# Patient Record
Sex: Female | Born: 1965 | Race: White | Hispanic: No | Marital: Married | Smoking: Former smoker
Health system: Southern US, Community
[De-identification: ages and names within clinical notes are randomized; demographics above are authoritative.]

## PROBLEM LIST (undated history)

## (undated) DIAGNOSIS — I639 Cerebral infarction, unspecified: Secondary | ICD-10-CM

## (undated) DIAGNOSIS — Z8489 Family history of other specified conditions: Secondary | ICD-10-CM

## (undated) DIAGNOSIS — F329 Major depressive disorder, single episode, unspecified: Secondary | ICD-10-CM

## (undated) DIAGNOSIS — M1990 Unspecified osteoarthritis, unspecified site: Secondary | ICD-10-CM

## (undated) DIAGNOSIS — R51 Headache: Secondary | ICD-10-CM

## (undated) DIAGNOSIS — F32A Depression, unspecified: Secondary | ICD-10-CM

## (undated) DIAGNOSIS — T300 Burn of unspecified body region, unspecified degree: Secondary | ICD-10-CM

## (undated) DIAGNOSIS — R519 Headache, unspecified: Secondary | ICD-10-CM

## (undated) DIAGNOSIS — K5792 Diverticulitis of intestine, part unspecified, without perforation or abscess without bleeding: Secondary | ICD-10-CM

## (undated) DIAGNOSIS — I82409 Acute embolism and thrombosis of unspecified deep veins of unspecified lower extremity: Secondary | ICD-10-CM

## (undated) HISTORY — PX: CHOLECYSTECTOMY: SHX55

## (undated) HISTORY — PX: JOINT REPLACEMENT: SHX530

## (undated) HISTORY — PX: LAPAROSCOPIC SIGMOID COLECTOMY: SHX5928

## (undated) HISTORY — PX: OTHER SURGICAL HISTORY: SHX169

## (undated) HISTORY — PX: ABDOMINAL HYSTERECTOMY: SHX81

## (undated) HISTORY — PX: FOOT SURGERY: SHX648

## (undated) HISTORY — PX: ENDOMETRIAL ABLATION: SHX621

## (undated) HISTORY — PX: COLONOSCOPY W/ POLYPECTOMY: SHX1380

---
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
## 2005-04-09 ENCOUNTER — Ambulatory Visit: Payer: Self-pay

## 2006-08-10 ENCOUNTER — Ambulatory Visit: Payer: Self-pay | Admitting: Obstetrics and Gynecology

## 2008-01-03 ENCOUNTER — Ambulatory Visit: Payer: Self-pay

## 2008-05-11 ENCOUNTER — Ambulatory Visit: Payer: Self-pay | Admitting: Family Medicine

## 2008-08-24 ENCOUNTER — Ambulatory Visit: Payer: Self-pay | Admitting: Specialist

## 2009-03-01 ENCOUNTER — Ambulatory Visit: Payer: Self-pay | Admitting: Obstetrics and Gynecology

## 2009-03-01 ENCOUNTER — Ambulatory Visit: Payer: Self-pay | Admitting: Specialist

## 2009-04-26 ENCOUNTER — Ambulatory Visit: Payer: Self-pay | Admitting: Family Medicine

## 2009-08-09 ENCOUNTER — Ambulatory Visit: Payer: Self-pay | Admitting: Gastroenterology

## 2010-06-03 ENCOUNTER — Ambulatory Visit: Payer: Self-pay | Admitting: Obstetrics and Gynecology

## 2010-06-05 ENCOUNTER — Ambulatory Visit: Payer: Self-pay | Admitting: Obstetrics and Gynecology

## 2011-05-12 ENCOUNTER — Ambulatory Visit: Payer: Self-pay | Admitting: Surgery

## 2012-03-29 ENCOUNTER — Ambulatory Visit: Payer: Self-pay | Admitting: Obstetrics and Gynecology

## 2013-05-01 ENCOUNTER — Ambulatory Visit: Payer: Self-pay | Admitting: Obstetrics and Gynecology

## 2013-05-04 ENCOUNTER — Ambulatory Visit: Payer: Self-pay | Admitting: Obstetrics and Gynecology

## 2013-10-20 ENCOUNTER — Ambulatory Visit: Payer: Self-pay | Admitting: Gastroenterology

## 2013-11-30 ENCOUNTER — Inpatient Hospital Stay: Payer: Self-pay | Admitting: Surgery

## 2013-12-01 LAB — PLATELET COUNT: Platelet: 221 10*3/uL (ref 150–440)

## 2013-12-01 LAB — PATHOLOGY REPORT

## 2013-12-05 LAB — PLATELET COUNT: Platelet: 245 10*3/uL (ref 150–440)

## 2014-10-15 ENCOUNTER — Ambulatory Visit: Payer: Self-pay | Admitting: Internal Medicine

## 2015-04-19 NOTE — Op Note (Signed)
PATIENT NAME:  Krista Allen, Krista Allen MR#:  [id number] DATE OF BIRTH:  1966/08/26  DATE OF PROCEDURE:  11/30/2013  PREOPERATIVE DIAGNOSIS: Diverticulitis.   POSTOPERATIVE DIAGNOSIS: Diverticulitis.   PROCEDURE: Low anterior resection of the sigmoid colon.   SURGEON: Adella Hare, MD  ANESTHESIA: General.   INDICATIONS: This 49 year old female has a history of multiple bouts of diverticulitis. Has had 4 episodes this year. Has had CT findings of diverticulosis and diverticulitis, and elective surgery was recommended to avoid future bouts of diverticulitis.   DETAILS OF PROCEDURE: The patient was placed on the operating table in the supine position under general endotracheal anesthesia. The circulating nurse inserted a Foley urinary catheter, with Betadine preparation of the perineum, draining a clear yellow urine. The legs were placed into the lithotomy position using bumblebee stirrups. The digital rectal exam demonstrated no gross feces within the rectum. Next, the perineum was prepared with Betadine solution, and the abdomen was prepared with ChloraPrep, and the abdomen was draped out in a sterile manner.   A lower abdominal midline incision was made from the umbilicus to the pubic symphysis, carried down through subcutaneous tissues. Numerous small bleeding points were cauterized. The midline fascia was incised, and the peritoneum was incised, and the peritoneal cavity was opened. Initial inspection revealed there was no palpable mass within the liver. There was no distention of the stomach. The appendix appeared normal. The uterus appeared normal. Right ovary appeared normal. The left ovary had an approximately 1.5 cm cyst which appeared to be round and smooth. The sigmoid colon was with some evidence of inflammation in the mid to the distal sigmoid colon with marked thickening of the wall of the sigmoid colon. Multiple diverticula were noted.   The sigmoid colon was mobilized with incision of  the lateral peritoneal reflection and also mobilized the descending colon and the splenic flexure. This took a significant amount of time mobilizing the splenic flexure, and mobilized the bowel to the point that some soft pliable colon could be brought adjacent to the proximal aspect of the rectum. It is further noted that Balfour retractor was used and also the bladder blade. The mid and distal sigmoid colon was further mobilized. A proximal point of resection was selected where the bowel was soft and pliable above the inflamed part, and the bowel was divided approximately at the junction of the proximal third of the sigmoid colon with the middle third, and a small window was created in the mesentery with blunt dissection, and began the mesenteric dissection with the Harmonic scalpel and then divided the bowel with the 75 mm GIA stapler. The mesenteric dissection was continued. Two arteries were ligated with 0 chromic and divided with the Harmonic scalpel. Also, there was 1 vein which was ligated with 0 chromic and divided with the Harmonic scalpel. The dissection was carried down distally and identified the point in the bowel where the end of the tenia spread out just above the rectum, and at this point, a window was created with dissection posterior to the junction of the sigmoid colon and rectum, and created a window large enough to pass a finger around. Next, the mesenteric dissection was further completed with the Harmonic scalpel. The bowel at the junction of the sigmoid colon and rectum just below the tenia was divided by placing the TA 30 stapler across the bowel and then excising just above this with a scalpel, and the specimen, which was approximately 10 inches in length, appeared to contract to approximately  8 inches and was submitted in formalin for routine pathology, noting that the proximal end contained a staple line   The distal staple line was further inspected and appeared to be intact. Next,  the proximal staple line was excised, and the edges of the bowel were held with Allis clamps. The 25 mm sizer fit snugly into the bowel, and elected to create the anastomosis with the 25 mm EEA stapler. A 3-0 Prolene pursestring suture was placed in the full thickness of the proximal portion of the bowel and then inserted the anvil of the stapler and tied down the pursestring. Next, a small window was created in the drape to expose the anus. The 25 mm sizer was introduced through the anus and passed up to the staple line, went in easily, and subsequently used the 28 mm sizer, which also went in easily, and subsequently inserted the 25 mm EEA stapler and advanced up to the staple line. The pin was brought out just anterior to the staple line and was attached to the anvil. The EEA was subsequently engaged to the firing range, and then the stapler was activated. It was then disengaged and removed. The anastomotic rings were examined, and it appeared that the proximal ring did have a defect in it.   After changing gloves, the anastomosis was inspected, and did identify a defect in the anastomosis which was anterior and to the right. This defect was repaired with 4-0 Vicryl sutures, placing a total of 5 sutures to repair this defect, and then the anastomosis looked good.   The rigid sigmoidoscope was introduced through the anus and passed up to through the rectum and identified the anastomosis. There was no air leak identified as the bowel was insufflated with air. Just a trace of blood was seen, and the air was removed, and then the sigmoidoscope was removed.   Gloves, gown and instruments were changed, put new folded towels around the operative site, used new Bovie and suction. Next, the pelvis was examined, and also the anastomosis was examined. The anastomosis looked good. The pelvis was irrigated with saline solution and aspirated. Hemostasis was intact. The left colic gutter was also inspected, and there was  just a minimal amount of serosanguineous drainage which was removed with lap pack. Next, the omentum was brought beneath the wound, and lap pack count was correct. The peritoneum was closed with running 3-0 chromic, and the fascia was closed with interrupted 0 Maxon figure-of-eight sutures. The skin was closed with clips. Dressings were applied with paper tape. The patient tolerated surgery satisfactorily. Foley catheter was removed. The patient was prepared for transfer to the recovery room.    ____________________________ Shela CommonsJ. Renda RollsWilton Smith, MD jws:lb D: 11/30/2013 10:48:09 ET T: 11/30/2013 11:11:10 ET JOB#: [id number]  cc: Adella HareJ. Wilton Smith, MD, <Dictator> Adella HareWILTON J SMITH MD ELECTRONICALLY SIGNED 12/05/2013 19:28

## 2015-04-19 NOTE — Discharge Summary (Signed)
PATIENT NAME:  Krista MartinezMANESS, Valora J MR#:  [id number] DATE OF BIRTH:  November 22, 1966  DATE OF ADMISSION:  11/30/2013 DATE OF DISCHARGE:  12/05/2013  This 49 year old female has had multiple bouts of diverticulitis, CT findings of diverticulitis and admitted for elective sigmoid resection.   PAST MEDICAL HISTORY: Includes cholecystectomy, NovaSure treatment, headaches, recent cold.   MEDICATIONS: Include Xanax, Zyrtec, Singulair.   PHYSICAL FINDINGS: PULMONARY:  Lung sounds were clear.  HEART: Regular rhythm, S1 and S2.  ABDOMEN: Soft, flat and nontender.   The patient came in through the outpatient surgery department. Did have bowel preparation at home. Did have a preop prophylactic antibiotic and had sigmoid colectomy. Postoperatively, she was treated with analgesics and IV fluids. Initially kept n.p.o., but then started on a clear liquid diet and later advanced to full liquids. Did demonstrate bowel function and was advanced to solid food and progressed satisfactorily. Her incision was clean and dry at time of discharge.   Pathology demonstrated diverticulitis of the sigmoid colon with ruptured diverticulum.   FINAL DIAGNOSIS: Diverticulitis.   OPERATION: Low anterior resection of the sigmoid colon.   Discharge instructions were given and plans made for followup in the office.    ____________________________ J. Renda RollsWilton Smith, MD jws:dmm D: 12/20/2013 17:38:43 ET T: 12/20/2013 19:44:15 ET JOB#: [id number]  cc: Adella HareJ. Wilton Smith, MD, <Dictator> Adella HareWILTON J SMITH MD ELECTRONICALLY SIGNED 12/24/2013 21:01

## 2015-09-30 ENCOUNTER — Other Ambulatory Visit: Payer: Self-pay | Admitting: Obstetrics and Gynecology

## 2015-09-30 DIAGNOSIS — Z1231 Encounter for screening mammogram for malignant neoplasm of breast: Secondary | ICD-10-CM

## 2015-10-21 ENCOUNTER — Ambulatory Visit: Payer: Self-pay | Attending: Obstetrics and Gynecology

## 2017-07-13 ENCOUNTER — Other Ambulatory Visit: Payer: Self-pay | Admitting: Student

## 2017-07-13 DIAGNOSIS — M25561 Pain in right knee: Secondary | ICD-10-CM

## 2017-08-05 ENCOUNTER — Inpatient Hospital Stay
Admission: RE | Admit: 2017-08-05 | Discharge: 2017-08-05 | Disposition: A | Discharge status: Home / Self Care | Payer: Self-pay | Source: Ambulatory Visit

## 2017-08-05 HISTORY — DX: Diverticulitis of intestine, part unspecified, without perforation or abscess without bleeding: K57.92

## 2017-08-05 HISTORY — DX: Headache: R51

## 2017-08-05 HISTORY — DX: Unspecified osteoarthritis, unspecified site: M19.90

## 2017-08-05 HISTORY — DX: Headache, unspecified: R51.9

## 2017-08-06 ENCOUNTER — Inpatient Hospital Stay: Admission: RE | Admit: 2017-08-06 | Payer: Self-pay | Source: Ambulatory Visit

## 2017-08-09 ENCOUNTER — Encounter
Admission: RE | Admit: 2017-08-09 | Discharge: 2017-08-09 | Disposition: A | Discharge status: Home / Self Care | Payer: 59 | Source: Ambulatory Visit | Attending: Surgery | Admitting: Surgery

## 2017-08-09 NOTE — Patient Instructions (Signed)
  Your procedure is scheduled on: 08-12-17 Report to Same Day Surgery 2nd floor medical mall [facility](Medical Mall Entrance-take elevator on left to 2nd floor.  Check in with surgery information desk.) To find out your arrival time please call [phone] between 1PM - 3PM on 08-11-17  Remember: Instructions that are not followed completely may result in serious medical risk, up to and including death, or upon the discretion of your surgeon and anesthesiologist your surgery may need to be rescheduled.    _x___ 1. Do not eat food or drink liquids after midnight. No gum chewing or hard candies.     __x__ 2. No Alcohol for 24 hours before or after surgery.   __x__3. No Smoking for 24 prior to surgery.   ____  4. Bring all medications with you on the day of surgery if instructed.    __x__ 5. Notify your doctor if there is any change in your medical condition     (cold, fever, infections).     Do not wear jewelry, make-up, hairpins, clips or nail polish.  Do not wear lotions, powders, or perfumes. You may wear deodorant.  Do not shave 48 hours prior to surgery. Men may shave face and neck.  Do not bring valuables to the hospital.    [facility] for any belongings or valuables.               Contacts, dentures or bridgework may not be worn into surgery.  Leave your suitcase in the car. After surgery it may be brought to your room.  For patients admitted to the hospital, discharge time is determined by your                       treatment team.   Patients discharged the day of surgery will not be allowed to drive home.  You will need someone to drive you home and stay with you the night of your procedure.    Please read over the following fact sheets that you were given:   [facility] Preparing for Surgery and or MRSA Information   _x___ Take anti-hypertensive (unless it includes a diuretic), cardiac, seizure, asthma,     anti-reflux and psychiatric medicines. These include:  1.  EFFEXOR  2. YOU MAY TAKE XANAX IF NEEDED AM OF SURGERY WITH A SMALL SIP OF WATER  3.  4.  5.  6.  ____Fleets enema or Magnesium Citrate as directed.   ____ Use CHG Soap or sage wipes as directed on instruction sheet   ____ Use inhalers on the day of surgery and bring to hospital day of surgery  ____ Stop Metformin and Janumet 2 days prior to surgery.    ____ Take 1/2 of usual insulin dose the night before surgery and none on the morning     surgery.   ____ Follow recommendations from Cardiologist, Pulmonologist or PCP regarding  stopping Aspirin, Coumadin, Pllavix ,Eliquis, Effient, or Pradaxa, and Pletal.  X____Stop Anti-inflammatories such as Advil, Aleve, IBUPROFEN, Motrin, Naproxen, Naprosyn, Goodies powders or aspirin products NOW-OK to take Tylenol  ____ Stop supplements until after surgery.     ____ Bring C-Pap to the hospital.

## 2017-08-11 MED ORDER — CEFAZOLIN SODIUM-DEXTROSE 2-4 GM/100ML-% IV SOLN
2.0000 g | Freq: Once | INTRAVENOUS | Status: AC
  Administered 2017-08-12: 2 g via INTRAVENOUS

## 2017-08-12 ENCOUNTER — Ambulatory Visit
Admission: RE | Admit: 2017-08-12 | Discharge: 2017-08-12 | Disposition: A | Discharge status: Home / Self Care | Payer: 59 | Source: Ambulatory Visit | Attending: Surgery | Admitting: Surgery

## 2017-08-12 ENCOUNTER — Encounter
Admission: RE | Disposition: A | Discharge status: Home / Self Care | Payer: Self-pay | Source: Ambulatory Visit | Attending: Surgery

## 2017-08-12 ENCOUNTER — Encounter: Payer: Self-pay | Admitting: *Deleted

## 2017-08-12 ENCOUNTER — Ambulatory Visit: Payer: 59 | Admitting: Certified Registered Nurse Anesthetist

## 2017-08-12 DIAGNOSIS — Z86010 Personal history of colonic polyps: Secondary | ICD-10-CM | POA: Insufficient documentation

## 2017-08-12 DIAGNOSIS — M6751 Plica syndrome, right knee: Secondary | ICD-10-CM | POA: Insufficient documentation

## 2017-08-12 DIAGNOSIS — Z9049 Acquired absence of other specified parts of digestive tract: Secondary | ICD-10-CM | POA: Insufficient documentation

## 2017-08-12 DIAGNOSIS — Z79899 Other long term (current) drug therapy: Secondary | ICD-10-CM | POA: Insufficient documentation

## 2017-08-12 DIAGNOSIS — M1711 Unilateral primary osteoarthritis, right knee: Secondary | ICD-10-CM | POA: Insufficient documentation

## 2017-08-12 DIAGNOSIS — Z823 Family history of stroke: Secondary | ICD-10-CM | POA: Diagnosis not present

## 2017-08-12 DIAGNOSIS — M94261 Chondromalacia, right knee: Secondary | ICD-10-CM | POA: Insufficient documentation

## 2017-08-12 DIAGNOSIS — K589 Irritable bowel syndrome without diarrhea: Secondary | ICD-10-CM | POA: Insufficient documentation

## 2017-08-12 DIAGNOSIS — E538 Deficiency of other specified B group vitamins: Secondary | ICD-10-CM | POA: Diagnosis not present

## 2017-08-12 DIAGNOSIS — Z801 Family history of malignant neoplasm of trachea, bronchus and lung: Secondary | ICD-10-CM | POA: Insufficient documentation

## 2017-08-12 DIAGNOSIS — G43909 Migraine, unspecified, not intractable, without status migrainosus: Secondary | ICD-10-CM | POA: Insufficient documentation

## 2017-08-12 DIAGNOSIS — M1990 Unspecified osteoarthritis, unspecified site: Secondary | ICD-10-CM | POA: Insufficient documentation

## 2017-08-12 DIAGNOSIS — S83241A Other tear of medial meniscus, current injury, right knee, initial encounter: Secondary | ICD-10-CM | POA: Diagnosis present

## 2017-08-12 DIAGNOSIS — Z8249 Family history of ischemic heart disease and other diseases of the circulatory system: Secondary | ICD-10-CM | POA: Diagnosis not present

## 2017-08-12 DIAGNOSIS — M23231 Derangement of other medial meniscus due to old tear or injury, right knee: Secondary | ICD-10-CM | POA: Insufficient documentation

## 2017-08-12 DIAGNOSIS — Z8042 Family history of malignant neoplasm of prostate: Secondary | ICD-10-CM | POA: Diagnosis not present

## 2017-08-12 HISTORY — PX: KNEE ARTHROSCOPY WITH MENISCAL REPAIR: SHX5653

## 2017-08-12 LAB — POCT PREGNANCY, URINE: Preg Test, Ur: NEGATIVE

## 2017-08-12 SURGERY — ARTHROSCOPY, KNEE, WITH MENISCUS REPAIR
Anesthesia: General | Site: Knee | Laterality: Right | Wound class: Clean

## 2017-08-12 MED ORDER — FENTANYL CITRATE (PF) 100 MCG/2ML IJ SOLN
25.0000 ug | INTRAMUSCULAR | Status: DC | PRN
  Administered 2017-08-12 (×4): 25 ug via INTRAVENOUS

## 2017-08-12 MED ORDER — ONDANSETRON HCL 4 MG PO TABS: 4.0000 mg | ORAL_TABLET | Freq: Four times a day (QID) | ORAL | Status: DC | PRN

## 2017-08-12 MED ORDER — METOCLOPRAMIDE HCL 5 MG/ML IJ SOLN: 5.0000 mg | Freq: Three times a day (TID) | INTRAMUSCULAR | Status: DC | PRN

## 2017-08-12 MED ORDER — ONDANSETRON HCL 4 MG/2ML IJ SOLN
INTRAMUSCULAR | Status: DC | PRN
  Administered 2017-08-12: 4 mg via INTRAVENOUS

## 2017-08-12 MED ORDER — OXYCODONE HCL 5 MG PO TABS
5.0000 mg | ORAL_TABLET | Freq: Once | ORAL | Status: AC | PRN
  Administered 2017-08-12: 5 mg via ORAL

## 2017-08-12 MED ORDER — FENTANYL CITRATE (PF) 100 MCG/2ML IJ SOLN
INTRAMUSCULAR | Status: AC
  Filled 2017-08-12: qty 2

## 2017-08-12 MED ORDER — PROPOFOL 10 MG/ML IV BOLUS
INTRAVENOUS | Status: AC
  Filled 2017-08-12: qty 20

## 2017-08-12 MED ORDER — MEPERIDINE HCL 50 MG/ML IJ SOLN: 6.2500 mg | INTRAMUSCULAR | Status: DC | PRN

## 2017-08-12 MED ORDER — LIDOCAINE HCL (CARDIAC) 20 MG/ML IV SOLN
INTRAVENOUS | Status: DC | PRN
  Administered 2017-08-12: 100 mg via INTRAVENOUS

## 2017-08-12 MED ORDER — FENTANYL CITRATE (PF) 100 MCG/2ML IJ SOLN
INTRAMUSCULAR | Status: DC | PRN
  Administered 2017-08-12 (×2): 50 ug via INTRAVENOUS

## 2017-08-12 MED ORDER — ONDANSETRON HCL 4 MG/2ML IJ SOLN: 4.0000 mg | Freq: Four times a day (QID) | INTRAMUSCULAR | Status: DC | PRN

## 2017-08-12 MED ORDER — OXYCODONE HCL 5 MG/5ML PO SOLN: 5.0000 mg | Freq: Once | ORAL | Status: AC | PRN

## 2017-08-12 MED ORDER — LIDOCAINE HCL (PF) 2 % IJ SOLN
INTRAMUSCULAR | Status: AC
  Filled 2017-08-12: qty 2

## 2017-08-12 MED ORDER — EPHEDRINE SULFATE 50 MG/ML IJ SOLN
INTRAMUSCULAR | Status: AC
  Filled 2017-08-12: qty 1

## 2017-08-12 MED ORDER — FAMOTIDINE 20 MG PO TABS
20.0000 mg | ORAL_TABLET | Freq: Once | ORAL | Status: AC
  Administered 2017-08-12: 20 mg via ORAL

## 2017-08-12 MED ORDER — KETOROLAC TROMETHAMINE 30 MG/ML IJ SOLN
INTRAMUSCULAR | Status: AC
  Filled 2017-08-12: qty 1

## 2017-08-12 MED ORDER — OXYCODONE HCL 5 MG PO TABS: 5.0000 mg | ORAL_TABLET | ORAL | Status: DC | PRN

## 2017-08-12 MED ORDER — EPHEDRINE SULFATE 50 MG/ML IJ SOLN
INTRAMUSCULAR | Status: DC | PRN
  Administered 2017-08-12 (×2): 10 mg via INTRAVENOUS

## 2017-08-12 MED ORDER — ONDANSETRON HCL 4 MG/2ML IJ SOLN
INTRAMUSCULAR | Status: AC
  Filled 2017-08-12: qty 2

## 2017-08-12 MED ORDER — PROPOFOL 10 MG/ML IV BOLUS
INTRAVENOUS | Status: DC | PRN
  Administered 2017-08-12: 160 mg via INTRAVENOUS

## 2017-08-12 MED ORDER — MIDAZOLAM HCL 2 MG/2ML IJ SOLN
INTRAMUSCULAR | Status: DC | PRN
  Administered 2017-08-12: 2 mg via INTRAVENOUS

## 2017-08-12 MED ORDER — POTASSIUM CHLORIDE IN NACL 20-0.9 MEQ/L-% IV SOLN: INTRAVENOUS | Status: DC

## 2017-08-12 MED ORDER — LACTATED RINGERS IV SOLN
INTRAVENOUS | Status: DC
  Administered 2017-08-12: 10:00:00 via INTRAVENOUS

## 2017-08-12 MED ORDER — MIDAZOLAM HCL 2 MG/2ML IJ SOLN
INTRAMUSCULAR | Status: AC
  Filled 2017-08-12: qty 2

## 2017-08-12 MED ORDER — LIDOCAINE HCL 1 % IJ SOLN
INTRAMUSCULAR | Status: DC | PRN
  Administered 2017-08-12: 30 mL

## 2017-08-12 MED ORDER — OXYCODONE HCL 5 MG PO TABS
ORAL_TABLET | ORAL | Status: DC
Start: 2017-08-12 — End: 2017-08-12
  Filled 2017-08-12: qty 1

## 2017-08-12 MED ORDER — BUPIVACAINE-EPINEPHRINE (PF) 0.5% -1:200000 IJ SOLN
INTRAMUSCULAR | Status: DC | PRN
  Administered 2017-08-12 (×2): 30 mL via PERINEURAL

## 2017-08-12 MED ORDER — DEXAMETHASONE SODIUM PHOSPHATE 10 MG/ML IJ SOLN
INTRAMUSCULAR | Status: DC | PRN
  Administered 2017-08-12: 10 mg via INTRAVENOUS

## 2017-08-12 MED ORDER — METOCLOPRAMIDE HCL 10 MG PO TABS: 5.0000 mg | ORAL_TABLET | Freq: Three times a day (TID) | ORAL | Status: DC | PRN

## 2017-08-12 MED ORDER — PROMETHAZINE HCL 25 MG/ML IJ SOLN: 6.2500 mg | INTRAMUSCULAR | Status: DC | PRN

## 2017-08-12 MED ORDER — DEXAMETHASONE SODIUM PHOSPHATE 10 MG/ML IJ SOLN
INTRAMUSCULAR | Status: AC
  Filled 2017-08-12: qty 1

## 2017-08-12 SURGICAL SUPPLY — 33 items
BAG COUNTER SPONGE EZ (MISCELLANEOUS) IMPLANT
BANDAGE ACE 6X5 VEL STRL LF (GAUZE/BANDAGES/DRESSINGS) ×3 IMPLANT
BLADE FULL RADIUS 3.5 (BLADE) ×3 IMPLANT
BLADE SHAVER 4.5X7 STR FR (MISCELLANEOUS) ×3 IMPLANT
CHLORAPREP W/TINT 26ML (MISCELLANEOUS) ×3 IMPLANT
COUNTER SPONGE BAG EZ (MISCELLANEOUS)
CUFF TOURN 24 STER (MISCELLANEOUS) IMPLANT
CUFF TOURN 30 STER DUAL PORT (MISCELLANEOUS) IMPLANT
DRAPE IMP U-DRAPE 54X76 (DRAPES) ×3 IMPLANT
ELECT REM PT RETURN 9FT ADLT (ELECTROSURGICAL) ×3
ELECTRODE REM PT RTRN 9FT ADLT (ELECTROSURGICAL) ×1 IMPLANT
GAUZE SPONGE 4X4 12PLY STRL (GAUZE/BANDAGES/DRESSINGS) ×3 IMPLANT
GLOVE BIO SURGEON STRL SZ8 (GLOVE) ×6 IMPLANT
GLOVE BIOGEL M 7.0 STRL (GLOVE) ×6 IMPLANT
GLOVE BIOGEL PI IND STRL 7.5 (GLOVE) ×1 IMPLANT
GLOVE BIOGEL PI INDICATOR 7.5 (GLOVE) ×2
GLOVE INDICATOR 8.0 STRL GRN (GLOVE) ×3 IMPLANT
GOWN STRL REUS W/ TWL LRG LVL3 (GOWN DISPOSABLE) ×1 IMPLANT
GOWN STRL REUS W/ TWL XL LVL3 (GOWN DISPOSABLE) ×2 IMPLANT
GOWN STRL REUS W/TWL LRG LVL3 (GOWN DISPOSABLE) ×2
GOWN STRL REUS W/TWL XL LVL3 (GOWN DISPOSABLE) ×4
IV LACTATED RINGER IRRG 3000ML (IV SOLUTION) ×2
IV LR IRRIG 3000ML ARTHROMATIC (IV SOLUTION) ×1 IMPLANT
KIT RM TURNOVER STRD PROC AR (KITS) ×3 IMPLANT
MANIFOLD NEPTUNE II (INSTRUMENTS) ×3 IMPLANT
NEEDLE HYPO 21X1.5 SAFETY (NEEDLE) ×3 IMPLANT
PACK ARTHROSCOPY KNEE (MISCELLANEOUS) ×3 IMPLANT
PENCIL ELECTRO HAND CTR (MISCELLANEOUS) ×3 IMPLANT
SUT PROLENE 4 0 PS 2 18 (SUTURE) ×3 IMPLANT
SUT TICRON COATED BLUE 2 0 30 (SUTURE) IMPLANT
SYR 50ML LL SCALE MARK (SYRINGE) ×3 IMPLANT
TUBING ARTHRO INFLOW-ONLY STRL (TUBING) ×3 IMPLANT
WAND HAND CNTRL MULTIVAC 90 (MISCELLANEOUS) ×3 IMPLANT

## 2017-08-12 NOTE — Anesthesia Procedure Notes (Signed)
Procedure Name: LMA Insertion Date/Time: 08/12/2017 11:08 AM Performed by: Marlana SalvageJESSUP, Krista Monteleone Pre-anesthesia Checklist: Emergency Drugs available, Patient identified, Suction available, Patient being monitored and Timeout performed Patient Re-evaluated:Patient Re-evaluated prior to induction Oxygen Delivery Method: Circle system utilized Preoxygenation: Pre-oxygenation with 100% oxygen Induction Type: IV induction Ventilation: Mask ventilation without difficulty LMA: LMA inserted LMA Size: 4.0 Number of attempts: 1 Placement Confirmation: positive ETCO2 and breath sounds checked- equal and bilateral Dental Injury: Teeth and Oropharynx as per pre-operative assessment

## 2017-08-12 NOTE — Discharge Instructions (Signed)
Keep dressing dry and intact.  May shower after dressing changed on post-op day #4 (Monday).  Cover sutures with Band-Aids after drying off. Apply ice frequently to knee. Take ibuprofen 600-800 mg TID with meals for 7-10 days, then as necessary. Take pain medication as prescribed or ES Tylenol when needed.  May weight-bear as tolerated - use crutches or walker as needed. Follow-up in 10-14 days or as scheduled.

## 2017-08-12 NOTE — Progress Notes (Signed)
Pt states headache is better.

## 2017-08-12 NOTE — Anesthesia Post-op Follow-up Note (Signed)
Anesthesia QCDR form completed.        

## 2017-08-12 NOTE — Anesthesia Postprocedure Evaluation (Signed)
Anesthesia Post Note  Patient: Krista Allen  Procedure(s) Performed: Procedure(s) (LRB): KNEE ARTHROSCOPY WITH MEDIAL  MENISCAL REPAIR (Right)  Patient location during evaluation: PACU Anesthesia Type: General Level of consciousness: awake and alert and oriented Pain management: pain level controlled Vital Signs Assessment: post-procedure vital signs reviewed and stable Respiratory status: spontaneous breathing, nonlabored ventilation and respiratory function stable Cardiovascular status: blood pressure returned to baseline and stable Postop Assessment: no signs of nausea or vomiting Anesthetic complications: no     Last Vitals:  Vitals:   08/12/17 1335 08/12/17 1424  BP: 122/74 125/85  Pulse: 91 80  Resp: 16   Temp: (!) 36 C   SpO2: 99% 97%    Last Pain:  Vitals:   08/12/17 1320  TempSrc:   PainSc: 3                  Artice Bergerson

## 2017-08-12 NOTE — H&P (Signed)
Paper H&P to be scanned into permanent record. H&P reviewed and patient re-examined. No changes. 

## 2017-08-12 NOTE — Anesthesia Preprocedure Evaluation (Signed)
Anesthesia Evaluation  Patient identified by MRN, date of birth, ID band Patient awake    Reviewed: Allergy & Precautions, NPO status , Patient's Chart, lab work & pertinent test results  History of Anesthesia Complications Negative for: history of anesthetic complications  Airway Mallampati: III  TM Distance: >3 FB Neck ROM: Full    Dental  (+) Partial Upper   Pulmonary neg sleep apnea, neg COPD, former smoker,    breath sounds clear to auscultation- rhonchi (-) wheezing      Cardiovascular Exercise Tolerance: Good (-) hypertension(-) CAD and (-) Past MI  Rhythm:Regular Rate:Normal - Systolic murmurs and - Diastolic murmurs    Neuro/Psych  Headaches, negative psych ROS   GI/Hepatic negative GI ROS, Neg liver ROS,   Endo/Other  negative endocrine ROSneg diabetes  Renal/GU negative Renal ROS     Musculoskeletal  (+) Arthritis ,   Abdominal (+) - obese,   Peds  Hematology negative hematology ROS (+)   Anesthesia Other Findings Past Medical History: No date: Arthritis No date: Diverticulitis No date: Headache     Comment:  MIGRAINES   Reproductive/Obstetrics                             Anesthesia Physical Anesthesia Plan  ASA: II  Anesthesia Plan: General   Post-op Pain Management:    Induction: Intravenous  PONV Risk Score and Plan: 2 and Ondansetron and Dexamethasone  Airway Management Planned: LMA  Additional Equipment:   Intra-op Plan:   Post-operative Plan:   Informed Consent: I have reviewed the patients History and Physical, chart, labs and discussed the procedure including the risks, benefits and alternatives for the proposed anesthesia with the patient or authorized representative who has indicated his/her understanding and acceptance.   Dental advisory given  Plan Discussed with: CRNA and Anesthesiologist  Anesthesia Plan Comments:         Anesthesia  Quick Evaluation

## 2017-08-12 NOTE — Op Note (Signed)
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  Procedure:   Arthroscopic partial medial meniscectomy; arthroscopic abrasion chondroplasty of diffuse grade 3 chondromalacia changes involving the femoral condyle, the lateral femoral condyle, lateral tibial plateau, and the femoral trochlea; and debridement of suprapatella plica, right knee.  Surgeon:   Maryagnes AmosJ. Jeffrey Lukisha Procida, M.D.  Anesthesia:   General LMA.  Findings:   As above. There also were grade 3-4 chondral malacia changes involving the medial tibial plateau and focal grade 2-3 chondral malacia changes involving the medial patellar facet. The lateral meniscus was in satisfactory condition, as were the anterior and posterior cruciate ligaments.  Complications:   None.  EBL:   5 cc.  Total fluids:   500 cc of crystalloid.  Tourniquet time:   None  Drains:   None  Closure:   4-0 Prolene interrupted sutures.  Brief clinical note:   The patient is a 51 year old female with a history of progressively worsening right knee pain. Her symptoms have progressed despite medications, activity modification, etc. Her history and examination are consistent with degenerative joint disease with a possible medial meniscus tear. An MRI scan demonstrated a degenerative tear involving the posterior portion of the medial meniscus as well as tricompartmental degenerative changes. The patient presents at this time for arthroscopy, debridement, and partial medial meniscectomy.  Procedure:   The patient was brought into the operating room and lain in the supine position. After adequate general laryngeal mask anesthesia was obtained, a timeout was performed to verify the appropriate side. The patient's right knee was injected sterilely using a  solution of 30 cc of 1% lidocaine and 30 cc of 0.5% Sensorcaine with epinephrine. The right lower extremity was prepped with ChloraPrep solution before being draped sterilely. Preoperative antibiotics were administered. The expected portal sites were injected with 0.5% Sensorcaine with epinephrine before the camera was placed in the anterolateral portal and instrumentation performed through the anteromedial portal. The knee was sequentially examined beginning in the suprapatellar pouch, then progressing to the patellofemoral space, the medial gutter compartment, the notch, and finally the lateral compartment and gutter. The findings were as described above. Abundant reactive synovial tissues anteriorly were debrided using the full-radius resector in order to improve visualization. In doing so, a large thickened symptomatic suprapatellar plica was identified and resected.   The degeneratively torn portion of the medial meniscus was debrided back to stable margins using a combination of the mini-munchers and full-radius resector. Subsequent probing of the remaining rim demonstrated excellent stability. The areas of grade 3 chondromalacial changes involving the weightbearing portion of the medial femoral condyle, the weightbearing portion of the lateral femoral condyle, the central tibial plateau, and the anterior portion of the medial femoral condyle/femoral trochlea ("kissing lesion") all were debrided back to stable margins using the full-radius resector. The ArthroCare wand was inserted and used to lightly "anneal" the margins of the damaged areas in order to seal the articular cartilage.   The instruments were removed from the joint after suctioning the excess fluid. The portal sites were closed using 4-0 Prolene interrupted sutures before a sterile bulky dressing was applied to the knee. The patient was then awakened, extubated, and returned to the recovery room in satisfactory condition after tolerating the  procedure well.

## 2017-08-12 NOTE — Transfer of Care (Signed)
Immediate Anesthesia Transfer of Care Note  Patient: Krista Allen  Procedure(s) Performed: Procedure(s) with comments: KNEE ARTHROSCOPY WITH MEDIAL  MENISCAL REPAIR (Right) - With debridement  Patient Location: PACU  Anesthesia Type:General  Level of Consciousness: awake, alert , oriented and patient cooperative  Airway & Oxygen Therapy: Patient Spontanous Breathing and Patient connected to nasal cannula oxygen  Post-op Assessment: Report given to RN and Post -op Vital signs reviewed and stable  Post vital signs: Reviewed and stable  Last Vitals:  Vitals:   08/12/17 0931 08/12/17 1220  BP: 139/86 123/87  Pulse: 77 84  Resp: 16 20  Temp: (!) 36.3 C (!) 36.3 C  SpO2: 98% 98%    Last Pain:  Vitals:   08/12/17 1220  TempSrc: Temporal         Complications: No apparent anesthesia complications

## 2017-08-13 ENCOUNTER — Encounter: Payer: Self-pay | Admitting: Surgery

## 2017-08-26 ENCOUNTER — Other Ambulatory Visit: Payer: Self-pay | Admitting: Student

## 2017-08-26 DIAGNOSIS — M7121 Synovial cyst of popliteal space [Baker], right knee: Secondary | ICD-10-CM

## 2017-08-31 ENCOUNTER — Ambulatory Visit: Admission: RE | Admit: 2017-08-31 | Payer: 59 | Source: Ambulatory Visit

## 2017-09-03 ENCOUNTER — Ambulatory Visit
Admission: RE | Admit: 2017-09-03 | Discharge: 2017-09-03 | Disposition: A | Discharge status: Home / Self Care | Payer: 59 | Source: Ambulatory Visit | Attending: Student | Admitting: Student

## 2017-09-03 DIAGNOSIS — M7121 Synovial cyst of popliteal space [Baker], right knee: Secondary | ICD-10-CM | POA: Diagnosis not present

## 2017-09-03 MED ORDER — METHYLPREDNISOLONE ACETATE 80 MG/ML IJ SUSP
INTRAMUSCULAR | Status: AC | PRN
  Administered 2017-09-03: 120 mg via INTRA_ARTICULAR

## 2017-09-03 MED ORDER — METHYLPREDNISOLONE ACETATE 80 MG/ML IJ SUSP
INTRAMUSCULAR | Status: AC
  Filled 2017-09-03: qty 2

## 2017-09-03 NOTE — Procedures (Signed)
Pre Procedure Dx: Symptomatic Right Sided Baker's Cyst Post Procedural Dx: Same  Technically successful US guided aspiration of 50 cc of serous fluid from the Baker's Cyst, followed by injection of 120 mg of Depomedrol.  EBL: None  No immediate complications.   Katherina RightJay Madlyn Crosby, MD Pager #: [phone]

## 2017-09-13 ENCOUNTER — Other Ambulatory Visit: Payer: Self-pay | Admitting: Internal Medicine

## 2017-09-13 DIAGNOSIS — Z1231 Encounter for screening mammogram for malignant neoplasm of breast: Secondary | ICD-10-CM

## 2017-09-20 ENCOUNTER — Other Ambulatory Visit: Payer: Self-pay | Admitting: Surgery

## 2017-09-20 DIAGNOSIS — S83231D Complex tear of medial meniscus, current injury, right knee, subsequent encounter: Secondary | ICD-10-CM

## 2017-09-20 DIAGNOSIS — M1711 Unilateral primary osteoarthritis, right knee: Secondary | ICD-10-CM

## 2017-09-22 ENCOUNTER — Ambulatory Visit: Payer: 59

## 2017-09-24 ENCOUNTER — Ambulatory Visit: Payer: 59

## 2017-09-27 ENCOUNTER — Ambulatory Visit
Admission: RE | Admit: 2017-09-27 | Discharge: 2017-09-27 | Disposition: A | Discharge status: Home / Self Care | Payer: 59 | Source: Ambulatory Visit | Attending: Surgery | Admitting: Surgery

## 2017-09-27 DIAGNOSIS — M7121 Synovial cyst of popliteal space [Baker], right knee: Secondary | ICD-10-CM | POA: Insufficient documentation

## 2017-09-27 DIAGNOSIS — M1711 Unilateral primary osteoarthritis, right knee: Secondary | ICD-10-CM

## 2017-09-27 DIAGNOSIS — M2410 Other articular cartilage disorders, unspecified site: Secondary | ICD-10-CM | POA: Insufficient documentation

## 2017-09-27 DIAGNOSIS — S83231D Complex tear of medial meniscus, current injury, right knee, subsequent encounter: Secondary | ICD-10-CM

## 2017-09-27 DIAGNOSIS — M25461 Effusion, right knee: Secondary | ICD-10-CM | POA: Diagnosis not present

## 2017-09-27 DIAGNOSIS — M794 Hypertrophy of (infrapatellar) fat pad: Secondary | ICD-10-CM | POA: Diagnosis not present

## 2017-10-01 ENCOUNTER — Ambulatory Visit
Admission: RE | Admit: 2017-10-01 | Discharge: 2017-10-01 | Disposition: A | Discharge status: Home / Self Care | Payer: 59 | Source: Ambulatory Visit | Attending: Internal Medicine | Admitting: Internal Medicine

## 2017-10-01 DIAGNOSIS — Z1231 Encounter for screening mammogram for malignant neoplasm of breast: Secondary | ICD-10-CM | POA: Diagnosis not present

## 2017-10-27 ENCOUNTER — Inpatient Hospital Stay: Admission: RE | Admit: 2017-10-27 | Payer: 59 | Source: Ambulatory Visit

## 2017-11-01 ENCOUNTER — Ambulatory Visit
Admission: RE | Admit: 2017-11-01 | Discharge: 2017-11-01 | Disposition: A | Discharge status: Home / Self Care | Payer: 59 | Source: Ambulatory Visit | Attending: Surgery | Admitting: Surgery

## 2017-11-01 ENCOUNTER — Encounter
Admission: RE | Admit: 2017-11-01 | Discharge: 2017-11-01 | Disposition: A | Discharge status: Home / Self Care | Payer: 59 | Source: Ambulatory Visit | Attending: Surgery | Admitting: Surgery

## 2017-11-01 DIAGNOSIS — R918 Other nonspecific abnormal finding of lung field: Secondary | ICD-10-CM | POA: Diagnosis not present

## 2017-11-01 DIAGNOSIS — Z01812 Encounter for preprocedural laboratory examination: Secondary | ICD-10-CM | POA: Insufficient documentation

## 2017-11-01 DIAGNOSIS — Z01818 Encounter for other preprocedural examination: Secondary | ICD-10-CM | POA: Insufficient documentation

## 2017-11-01 DIAGNOSIS — Z01810 Encounter for preprocedural cardiovascular examination: Secondary | ICD-10-CM | POA: Diagnosis present

## 2017-11-01 DIAGNOSIS — M1711 Unilateral primary osteoarthritis, right knee: Secondary | ICD-10-CM | POA: Insufficient documentation

## 2017-11-01 LAB — URINALYSIS, COMPLETE (UACMP) WITH MICROSCOPIC
Bacteria, UA: NONE SEEN
Bilirubin Urine: NEGATIVE
GLUCOSE, UA: NEGATIVE mg/dL
HGB URINE DIPSTICK: NEGATIVE
KETONES UR: NEGATIVE mg/dL
LEUKOCYTES UA: NEGATIVE
Nitrite: NEGATIVE
PROTEIN: NEGATIVE mg/dL
RBC / HPF: NONE SEEN RBC/hpf (ref 0–5)
Specific Gravity, Urine: 1.006 (ref 1.005–1.030)
pH: 7 (ref 5.0–8.0)

## 2017-11-01 LAB — CBC
HEMATOCRIT: 42.6 % (ref 35.0–47.0)
HEMOGLOBIN: 14.4 g/dL (ref 12.0–16.0)
MCH: 32.2 pg (ref 26.0–34.0)
MCHC: 33.7 g/dL (ref 32.0–36.0)
MCV: 95.4 fL (ref 80.0–100.0)
Platelets: 256 10*3/uL (ref 150–440)
RBC: 4.46 MIL/uL (ref 3.80–5.20)
RDW: 13.4 % (ref 11.5–14.5)
WBC: 5.4 10*3/uL (ref 3.6–11.0)

## 2017-11-01 LAB — TYPE AND SCREEN
ABO/RH(D): B POS
Antibody Screen: NEGATIVE

## 2017-11-01 LAB — BASIC METABOLIC PANEL
Anion gap: 8 (ref 5–15)
BUN: 17 mg/dL (ref 6–20)
CHLORIDE: 100 mmol/L — AB (ref 101–111)
CO2: 28 mmol/L (ref 22–32)
CREATININE: 0.88 mg/dL (ref 0.44–1.00)
Calcium: 9.3 mg/dL (ref 8.9–10.3)
GFR calc non Af Amer: >60 mL/min (ref >60)
Glucose, Bld: 114 mg/dL — ABNORMAL HIGH (ref 65–99)
Potassium: 4 mmol/L (ref 3.5–5.1)
Sodium: 136 mmol/L (ref 135–145)

## 2017-11-01 LAB — SURGICAL PCR SCREEN
MRSA, PCR: NEGATIVE
Staphylococcus aureus: NEGATIVE

## 2017-11-01 LAB — PROTIME-INR
INR: 0.89
Prothrombin Time: 12 seconds (ref 11.4–15.2)

## 2017-11-01 NOTE — Patient Instructions (Signed)
Your procedure is scheduled on: 11/11/17  Thurs Report to Same Day Surgery 2nd floor medical mall [facility](Medical Mall Entrance-take elevator on left to 2nd floor.  Check in with surgery information desk.) To find out your arrival time please call [phone] between 1PM - 3PM on 11/10/17 Wed  Remember: Instructions that are not followed completely may result in serious medical risk, up to and including death, or upon the discretion of your surgeon and anesthesiologist your surgery may need to be rescheduled.    _x___ 1. Do not eat food after midnight the night before your procedure. You may drink clear liquids up to 2 hours before you are scheduled to arrive at the hospital for your procedure.  Do not drink clear liquids within 2 hours of your scheduled arrival to the hospital.  Clear liquids include  --Water or Apple juice without pulp  --Clear carbohydrate beverage such as ClearFast or Gatorade  --Black Coffee or Clear Tea (No milk, no creamers, do not add anything to                  the coffee or Tea Type 1 and type 2 diabetics should only drink water.  No gum chewing or hard candies.     __x__ 2. No Alcohol for 24 hours before or after surgery.   __x__3. No Smoking for 24 prior to surgery.   ____  4. Bring all medications with you on the day of surgery if instructed.    __x__ 5. Notify your doctor if there is any change in your medical condition     (cold, fever, infections).     Do not wear jewelry, make-up, hairpins, clips or nail polish.  Do not wear lotions, powders, or perfumes. You may wear deodorant.  Do not shave 48 hours prior to surgery. Men may shave face and neck.  Do not bring valuables to the hospital.    [facility] not responsible for any belongings or valuables.               Contacts, dentures or bridgework may not be worn into surgery.  Leave your suitcase in the car. After surgery it may be brought to your room.  For patients admitted to the hospital,  discharge time is determined by your                       treatment team.   Patients discharged the day of surgery will not be allowed to drive home.  You will need someone to drive you home and stay with you the night of your procedure.    Please read over the following fact sheets that you were given:   [facility] Preparing for Surgery and or MRSA Information   _x___ Take anti-hypertensive listed below, cardiac, seizure, asthma,     anti-reflux and psychiatric medicines. These include:  1. ALPRAZolam (XANAX) 0.5   2.venlafaxine XR (EFFEXOR-XR)  3.  4.  5.  6.  ____Fleets enema or Magnesium Citrate as directed.   _x___ Use CHG Soap or sage wipes as directed on instruction sheet   ____ Use inhalers on the day of surgery and bring to hospital day of surgery  ____ Stop Metformin and Janumet 2 days prior to surgery.    ____ Take 1/2 of usual insulin dose the night before surgery and none on the morning     surgery.   _x___ Follow recommendations from Cardiologist, Pulmonologist or PCP regarding  stopping Aspirin, Coumadin, Plavix ,Eliquis, Effient, or Pradaxa, and Pletal.  X____Stop Anti-inflammatories such as Advil, Aleve, Ibuprofen, Motrin, Naproxen, Naprosyn, Goodies powders or aspirin products. OK to take Tylenol and                          Celebrex.   _x___ Stop supplements until after surgery.  But may continue Vitamin D, Vitamin B,       and multivitamin.   ____ Bring C-Pap to the hospital.

## 2017-11-10 MED ORDER — CEFAZOLIN SODIUM-DEXTROSE 2-4 GM/100ML-% IV SOLN
2.0000 g | Freq: Once | INTRAVENOUS | Status: AC
  Administered 2017-11-11: 2 g via INTRAVENOUS

## 2017-11-11 ENCOUNTER — Observation Stay
Admission: RE | Admit: 2017-11-11 | Discharge: 2017-11-12 | Disposition: A | Discharge status: Home / Self Care | Payer: 59 | Source: Ambulatory Visit | Attending: Surgery | Admitting: Surgery

## 2017-11-11 ENCOUNTER — Other Ambulatory Visit: Payer: Self-pay

## 2017-11-11 ENCOUNTER — Encounter: Payer: Self-pay | Admitting: *Deleted

## 2017-11-11 ENCOUNTER — Inpatient Hospital Stay: Payer: 59 | Admitting: Anesthesiology

## 2017-11-11 ENCOUNTER — Observation Stay: Payer: 59

## 2017-11-11 ENCOUNTER — Encounter
Admission: RE | Disposition: A | Discharge status: Home / Self Care | Payer: Self-pay | Source: Ambulatory Visit | Attending: Surgery

## 2017-11-11 DIAGNOSIS — Z79899 Other long term (current) drug therapy: Secondary | ICD-10-CM | POA: Diagnosis not present

## 2017-11-11 DIAGNOSIS — Z6827 Body mass index (BMI) 27.0-27.9, adult: Secondary | ICD-10-CM | POA: Insufficient documentation

## 2017-11-11 DIAGNOSIS — Z87891 Personal history of nicotine dependence: Secondary | ICD-10-CM | POA: Diagnosis not present

## 2017-11-11 DIAGNOSIS — M1711 Unilateral primary osteoarthritis, right knee: Principal | ICD-10-CM | POA: Insufficient documentation

## 2017-11-11 DIAGNOSIS — E538 Deficiency of other specified B group vitamins: Secondary | ICD-10-CM | POA: Insufficient documentation

## 2017-11-11 DIAGNOSIS — Z96651 Presence of right artificial knee joint: Secondary | ICD-10-CM

## 2017-11-11 DIAGNOSIS — Z791 Long term (current) use of non-steroidal anti-inflammatories (NSAID): Secondary | ICD-10-CM | POA: Diagnosis not present

## 2017-11-11 DIAGNOSIS — E669 Obesity, unspecified: Secondary | ICD-10-CM | POA: Diagnosis not present

## 2017-11-11 HISTORY — PX: TOTAL KNEE ARTHROPLASTY: SHX125

## 2017-11-11 LAB — ABO/RH: ABO/RH(D): B POS

## 2017-11-11 SURGERY — ARTHROPLASTY, KNEE, TOTAL
Anesthesia: Spinal | Site: Knee | Laterality: Right | Wound class: Clean

## 2017-11-11 MED ORDER — PROMETHAZINE HCL 25 MG/ML IJ SOLN: 6.2500 mg | INTRAMUSCULAR | Status: DC | PRN

## 2017-11-11 MED ORDER — DIPHENHYDRAMINE HCL 12.5 MG/5ML PO ELIX: 12.5000 mg | ORAL_SOLUTION | ORAL | Status: DC | PRN

## 2017-11-11 MED ORDER — PHENYLEPHRINE HCL 10 MG/ML IJ SOLN
INTRAMUSCULAR | Status: DC | PRN
  Administered 2017-11-11: 200 ug via INTRAVENOUS
  Administered 2017-11-11 (×2): 100 ug via INTRAVENOUS

## 2017-11-11 MED ORDER — METAXALONE 800 MG PO TABS
400.0000 mg | ORAL_TABLET | Freq: Three times a day (TID) | ORAL | Status: DC | PRN
  Administered 2017-11-11 – 2017-11-12 (×3): 400 mg via ORAL
  Filled 2017-11-11 (×5): qty 0.5

## 2017-11-11 MED ORDER — FLEET ENEMA 7-19 GM/118ML RE ENEM: 1.0000 | ENEMA | Freq: Once | RECTAL | Status: DC | PRN

## 2017-11-11 MED ORDER — DEXAMETHASONE SODIUM PHOSPHATE 10 MG/ML IJ SOLN
INTRAMUSCULAR | Status: DC | PRN
  Administered 2017-11-11: 10 mg via INTRAVENOUS

## 2017-11-11 MED ORDER — ONDANSETRON HCL 4 MG/2ML IJ SOLN
4.0000 mg | Freq: Four times a day (QID) | INTRAMUSCULAR | Status: DC | PRN
  Administered 2017-11-11: 4 mg via INTRAVENOUS
  Filled 2017-11-11: qty 2

## 2017-11-11 MED ORDER — ACETAMINOPHEN 500 MG PO TABS
1000.0000 mg | ORAL_TABLET | Freq: Four times a day (QID) | ORAL | Status: AC
  Administered 2017-11-11 – 2017-11-12 (×3): 1000 mg via ORAL
  Filled 2017-11-11 (×3): qty 2

## 2017-11-11 MED ORDER — CEFAZOLIN SODIUM-DEXTROSE 2-4 GM/100ML-% IV SOLN
INTRAVENOUS | Status: AC
  Filled 2017-11-11: qty 100

## 2017-11-11 MED ORDER — SODIUM CHLORIDE 0.9 % IJ SOLN
INTRAMUSCULAR | Status: AC
  Filled 2017-11-11: qty 50

## 2017-11-11 MED ORDER — OXYCODONE HCL 5 MG PO TABS
10.0000 mg | ORAL_TABLET | ORAL | Status: DC | PRN
  Administered 2017-11-11 – 2017-11-12 (×4): 10 mg via ORAL
  Filled 2017-11-11 (×3): qty 2

## 2017-11-11 MED ORDER — PROPOFOL 500 MG/50ML IV EMUL
INTRAVENOUS | Status: AC
  Filled 2017-11-11: qty 100

## 2017-11-11 MED ORDER — FENTANYL CITRATE (PF) 100 MCG/2ML IJ SOLN
INTRAMUSCULAR | Status: AC
  Filled 2017-11-11: qty 2

## 2017-11-11 MED ORDER — PROPOFOL 500 MG/50ML IV EMUL
INTRAVENOUS | Status: DC | PRN
  Administered 2017-11-11: 150 ug/kg/min via INTRAVENOUS

## 2017-11-11 MED ORDER — TRANEXAMIC ACID 1000 MG/10ML IV SOLN
INTRAVENOUS | Status: DC | PRN
Start: 2017-11-11 — End: 2017-11-11
  Administered 2017-11-11: 1000 mg via INTRAVENOUS

## 2017-11-11 MED ORDER — MIDAZOLAM HCL 5 MG/5ML IJ SOLN
INTRAMUSCULAR | Status: DC | PRN
  Administered 2017-11-11: 2 mg via INTRAVENOUS

## 2017-11-11 MED ORDER — CEFAZOLIN SODIUM-DEXTROSE 2-4 GM/100ML-% IV SOLN
2.0000 g | Freq: Four times a day (QID) | INTRAVENOUS | Status: DC
  Filled 2017-11-11 (×3): qty 100

## 2017-11-11 MED ORDER — FENTANYL CITRATE (PF) 100 MCG/2ML IJ SOLN
INTRAMUSCULAR | Status: DC | PRN
  Administered 2017-11-11: 100 ug via INTRAVENOUS

## 2017-11-11 MED ORDER — FENTANYL CITRATE (PF) 100 MCG/2ML IJ SOLN: 25.0000 ug | INTRAMUSCULAR | Status: DC | PRN

## 2017-11-11 MED ORDER — ACETAMINOPHEN 325 MG PO TABS: 650.0000 mg | ORAL_TABLET | ORAL | Status: DC | PRN

## 2017-11-11 MED ORDER — BISACODYL 10 MG RE SUPP
10.0000 mg | Freq: Every day | RECTAL | Status: DC | PRN
  Filled 2017-11-11: qty 1

## 2017-11-11 MED ORDER — VENLAFAXINE HCL ER 75 MG PO CP24
225.0000 mg | ORAL_CAPSULE | Freq: Every day | ORAL | Status: DC
  Filled 2017-11-11: qty 1

## 2017-11-11 MED ORDER — ACETAMINOPHEN 650 MG RE SUPP: 650.0000 mg | RECTAL | Status: DC | PRN

## 2017-11-11 MED ORDER — OXYCODONE HCL 5 MG PO TABS
5.0000 mg | ORAL_TABLET | ORAL | Status: DC | PRN
  Administered 2017-11-11 – 2017-11-12 (×2): 5 mg via ORAL
  Filled 2017-11-11 (×3): qty 1

## 2017-11-11 MED ORDER — KETOROLAC TROMETHAMINE 15 MG/ML IJ SOLN
30.0000 mg | Freq: Once | INTRAMUSCULAR | Status: AC
  Administered 2017-11-11: 30 mg via INTRAVENOUS

## 2017-11-11 MED ORDER — ZOLPIDEM TARTRATE 5 MG PO TABS
5.0000 mg | ORAL_TABLET | Freq: Every day | ORAL | Status: DC
  Administered 2017-11-11: 5 mg via ORAL
  Filled 2017-11-11: qty 1

## 2017-11-11 MED ORDER — ONDANSETRON HCL 4 MG PO TABS
4.0000 mg | ORAL_TABLET | Freq: Four times a day (QID) | ORAL | Status: DC | PRN
  Administered 2017-11-11 – 2017-11-12 (×2): 4 mg via ORAL
  Filled 2017-11-11 (×2): qty 1

## 2017-11-11 MED ORDER — FAMOTIDINE 20 MG PO TABS
20.0000 mg | ORAL_TABLET | Freq: Once | ORAL | Status: AC
  Administered 2017-11-11: 20 mg via ORAL

## 2017-11-11 MED ORDER — METOCLOPRAMIDE HCL 10 MG PO TABS
5.0000 mg | ORAL_TABLET | Freq: Three times a day (TID) | ORAL | Status: DC | PRN
  Administered 2017-11-12: 10 mg via ORAL
  Filled 2017-11-11: qty 1

## 2017-11-11 MED ORDER — DEXTROSE 5 % IV SOLN
2.0000 g | Freq: Four times a day (QID) | INTRAVENOUS | Status: AC
  Administered 2017-11-11 – 2017-11-12 (×3): 2 g via INTRAVENOUS
  Filled 2017-11-11 (×3): qty 2000

## 2017-11-11 MED ORDER — GLYCOPYRROLATE 0.2 MG/ML IJ SOLN
INTRAMUSCULAR | Status: DC | PRN
  Administered 2017-11-11: 0.2 mg via INTRAVENOUS

## 2017-11-11 MED ORDER — KCL IN DEXTROSE-NACL 20-5-0.9 MEQ/L-%-% IV SOLN
INTRAVENOUS | Status: DC
  Administered 2017-11-11 – 2017-11-12 (×2): via INTRAVENOUS
  Filled 2017-11-11 (×4): qty 1000

## 2017-11-11 MED ORDER — MIDAZOLAM HCL 2 MG/2ML IJ SOLN
INTRAMUSCULAR | Status: AC
  Filled 2017-11-11: qty 2

## 2017-11-11 MED ORDER — DOCUSATE SODIUM 100 MG PO CAPS
100.0000 mg | ORAL_CAPSULE | Freq: Two times a day (BID) | ORAL | Status: DC
  Administered 2017-11-11 – 2017-11-12 (×3): 100 mg via ORAL
  Filled 2017-11-11 (×3): qty 1

## 2017-11-11 MED ORDER — DEXAMETHASONE SODIUM PHOSPHATE 10 MG/ML IJ SOLN
INTRAMUSCULAR | Status: AC
  Filled 2017-11-11: qty 1

## 2017-11-11 MED ORDER — NEOMYCIN-POLYMYXIN B GU 40-200000 IR SOLN
Status: DC | PRN
Start: 2017-11-11 — End: 2017-11-11
  Administered 2017-11-11: 14 mL

## 2017-11-11 MED ORDER — PANTOPRAZOLE SODIUM 40 MG PO TBEC
40.0000 mg | DELAYED_RELEASE_TABLET | Freq: Every day | ORAL | Status: DC
  Administered 2017-11-11 – 2017-11-12 (×2): 40 mg via ORAL
  Filled 2017-11-11 (×2): qty 1

## 2017-11-11 MED ORDER — METOCLOPRAMIDE HCL 5 MG/ML IJ SOLN: 5.0000 mg | Freq: Three times a day (TID) | INTRAMUSCULAR | Status: DC | PRN

## 2017-11-11 MED ORDER — KETOROLAC TROMETHAMINE 15 MG/ML IJ SOLN
INTRAMUSCULAR | Status: AC
  Administered 2017-11-11: 30 mg via INTRAVENOUS
  Filled 2017-11-11: qty 2

## 2017-11-11 MED ORDER — LIDOCAINE HCL (CARDIAC) 20 MG/ML IV SOLN
INTRAVENOUS | Status: DC | PRN
  Administered 2017-11-11: 50 mg via INTRAVENOUS

## 2017-11-11 MED ORDER — NEOMYCIN-POLYMYXIN B GU 40-200000 IR SOLN
Status: AC
  Filled 2017-11-11: qty 20

## 2017-11-11 MED ORDER — HYDROMORPHONE HCL 1 MG/ML IJ SOLN: 1.0000 mg | INTRAMUSCULAR | Status: DC | PRN

## 2017-11-11 MED ORDER — VENLAFAXINE HCL ER 75 MG PO CP24: 75.0000 mg | ORAL_CAPSULE | Freq: Every day | ORAL | Status: DC

## 2017-11-11 MED ORDER — PROPOFOL 10 MG/ML IV BOLUS
INTRAVENOUS | Status: DC | PRN
  Administered 2017-11-11 (×3): 30 mg via INTRAVENOUS

## 2017-11-11 MED ORDER — SODIUM CHLORIDE 0.9 % IV SOLN
INTRAVENOUS | Status: DC | PRN
  Administered 2017-11-11: 60 mL

## 2017-11-11 MED ORDER — MAGNESIUM HYDROXIDE 400 MG/5ML PO SUSP
30.0000 mL | Freq: Every day | ORAL | Status: DC | PRN
  Administered 2017-11-12: 30 mL via ORAL
  Filled 2017-11-11: qty 30

## 2017-11-11 MED ORDER — ENOXAPARIN SODIUM 40 MG/0.4ML ~~LOC~~ SOLN
40.0000 mg | SUBCUTANEOUS | Status: DC
  Administered 2017-11-12: 40 mg via SUBCUTANEOUS
  Filled 2017-11-11: qty 0.4

## 2017-11-11 MED ORDER — OXYCODONE HCL 5 MG/5ML PO SOLN: 5.0000 mg | Freq: Once | ORAL | Status: DC | PRN

## 2017-11-11 MED ORDER — BUPIVACAINE-EPINEPHRINE (PF) 0.5% -1:200000 IJ SOLN
INTRAMUSCULAR | Status: DC | PRN
  Administered 2017-11-11: 30 mL

## 2017-11-11 MED ORDER — ALPRAZOLAM 0.5 MG PO TABS: 0.5000 mg | ORAL_TABLET | Freq: Every day | ORAL | Status: DC | PRN

## 2017-11-11 MED ORDER — BUPIVACAINE LIPOSOME 1.3 % IJ SUSP
INTRAMUSCULAR | Status: AC
  Filled 2017-11-11: qty 20

## 2017-11-11 MED ORDER — PROPOFOL 500 MG/50ML IV EMUL
INTRAVENOUS | Status: AC
  Filled 2017-11-11: qty 50

## 2017-11-11 MED ORDER — TRANEXAMIC ACID 1000 MG/10ML IV SOLN
INTRAVENOUS | Status: AC
  Filled 2017-11-11: qty 10

## 2017-11-11 MED ORDER — VENLAFAXINE HCL ER 150 MG PO CP24: 150.0000 mg | ORAL_CAPSULE | Freq: Every day | ORAL | Status: DC

## 2017-11-11 MED ORDER — ONDANSETRON HCL 4 MG/2ML IJ SOLN
INTRAMUSCULAR | Status: DC | PRN
  Administered 2017-11-11: 4 mg via INTRAVENOUS

## 2017-11-11 MED ORDER — PROPOFOL 10 MG/ML IV BOLUS
INTRAVENOUS | Status: AC
  Filled 2017-11-11: qty 40

## 2017-11-11 MED ORDER — OXYCODONE HCL 5 MG PO TABS: 5.0000 mg | ORAL_TABLET | Freq: Once | ORAL | Status: DC | PRN

## 2017-11-11 MED ORDER — LACTATED RINGERS IV SOLN
INTRAVENOUS | Status: DC
  Administered 2017-11-11 (×2): via INTRAVENOUS

## 2017-11-11 MED ORDER — FAMOTIDINE 20 MG PO TABS
ORAL_TABLET | ORAL | Status: AC
  Administered 2017-11-11: 20 mg via ORAL
  Filled 2017-11-11: qty 1

## 2017-11-11 MED ORDER — VENLAFAXINE HCL ER 75 MG PO CP24
225.0000 mg | ORAL_CAPSULE | Freq: Every day | ORAL | Status: DC
  Administered 2017-11-12: 225 mg via ORAL
  Filled 2017-11-11 (×2): qty 3

## 2017-11-11 MED ORDER — MEPERIDINE HCL 50 MG/ML IJ SOLN: 6.2500 mg | INTRAMUSCULAR | Status: DC | PRN

## 2017-11-11 MED ORDER — KETOROLAC TROMETHAMINE 15 MG/ML IJ SOLN
15.0000 mg | Freq: Four times a day (QID) | INTRAMUSCULAR | Status: AC
  Administered 2017-11-11 – 2017-11-12 (×3): 15 mg via INTRAVENOUS
  Filled 2017-11-11 (×3): qty 1

## 2017-11-11 MED ORDER — BUPIVACAINE HCL (PF) 0.5 % IJ SOLN
INTRAMUSCULAR | Status: DC | PRN
  Administered 2017-11-11: 3 mL

## 2017-11-11 MED ORDER — BUPIVACAINE-EPINEPHRINE (PF) 0.5% -1:200000 IJ SOLN
INTRAMUSCULAR | Status: AC
  Filled 2017-11-11: qty 30

## 2017-11-11 SURGICAL SUPPLY — 58 items
BANDAGE ELASTIC 6 CLIP ST LF (GAUZE/BANDAGES/DRESSINGS) ×3 IMPLANT
BIT DRILL QUICK REL 1/8 2PK SL (DRILL) IMPLANT
BLADE SAW SAG 25X90X1.19 (BLADE) ×3 IMPLANT
BLADE SURG SZ20 CARB STEEL (BLADE) ×3 IMPLANT
CANISTER SUCT 1200ML W/VALVE (MISCELLANEOUS) ×3 IMPLANT
CANISTER SUCT 3000ML PPV (MISCELLANEOUS) ×3 IMPLANT
CAPT KNEE TOTAL 3 ×3 IMPLANT
CATH TRAY METER 16FR LF (MISCELLANEOUS) IMPLANT
CEMENT BONE R 1X40 (Cement) ×6 IMPLANT
CEMENT VACUUM MIXING SYSTEM (MISCELLANEOUS) ×3 IMPLANT
CHLORAPREP W/TINT 26ML (MISCELLANEOUS) ×3 IMPLANT
COOLER POLAR GLACIER W/PUMP (MISCELLANEOUS) ×3 IMPLANT
COVER MAYO STAND STRL (DRAPES) ×3 IMPLANT
CUFF TOURN 24 STER (MISCELLANEOUS) IMPLANT
CUFF TOURN 30 STER DUAL PORT (MISCELLANEOUS) IMPLANT
DRAPE IMP U-DRAPE 54X76 (DRAPES) ×3 IMPLANT
DRAPE INCISE IOBAN 66X45 STRL (DRAPES) ×3 IMPLANT
DRAPE SHEET LG 3/4 BI-LAMINATE (DRAPES) ×3 IMPLANT
DRILL QUICK RELEASE 1/8 INCH (DRILL)
DRSG OPSITE POSTOP 4X10 (GAUZE/BANDAGES/DRESSINGS) ×3 IMPLANT
DRSG OPSITE POSTOP 4X12 (GAUZE/BANDAGES/DRESSINGS) IMPLANT
DRSG OPSITE POSTOP 4X14 (GAUZE/BANDAGES/DRESSINGS) IMPLANT
ELECT CAUTERY BLADE 6.4 (BLADE) ×3 IMPLANT
ELECT REM PT RETURN 9FT ADLT (ELECTROSURGICAL) ×3
ELECTRODE REM PT RTRN 9FT ADLT (ELECTROSURGICAL) ×1 IMPLANT
GLOVE BIO SURGEON STRL SZ7.5 (GLOVE) ×12 IMPLANT
GLOVE BIO SURGEON STRL SZ8 (GLOVE) ×12 IMPLANT
GLOVE BIOGEL PI IND STRL 8 (GLOVE) ×1 IMPLANT
GLOVE BIOGEL PI INDICATOR 8 (GLOVE) ×2
GLOVE INDICATOR 8.0 STRL GRN (GLOVE) ×3 IMPLANT
GOWN STRL REUS W/ TWL LRG LVL3 (GOWN DISPOSABLE) ×1 IMPLANT
GOWN STRL REUS W/ TWL XL LVL3 (GOWN DISPOSABLE) ×1 IMPLANT
GOWN STRL REUS W/TWL LRG LVL3 (GOWN DISPOSABLE) ×2
GOWN STRL REUS W/TWL XL LVL3 (GOWN DISPOSABLE) ×2
HOLDER FOLEY CATH W/STRAP (MISCELLANEOUS) IMPLANT
HOOD PEEL AWAY FLYTE STAYCOOL (MISCELLANEOUS) ×12 IMPLANT
IMMBOLIZER KNEE 19 BLUE UNIV (SOFTGOODS) ×3 IMPLANT
KIT RM TURNOVER STRD PROC AR (KITS) ×3 IMPLANT
NDL SAFETY 18GX1.5 (NEEDLE) ×3 IMPLANT
NEEDLE 18GX1X1/2 (RX/OR ONLY) (NEEDLE) ×3 IMPLANT
NEEDLE SPNL 20GX3.5 QUINCKE YW (NEEDLE) ×3 IMPLANT
NS IRRIG 1000ML POUR BTL (IV SOLUTION) ×3 IMPLANT
PACK TOTAL KNEE (MISCELLANEOUS) ×3 IMPLANT
PAD WRAPON POLAR KNEE (MISCELLANEOUS) ×1 IMPLANT
PULSAVAC PLUS IRRIG FAN TIP (DISPOSABLE) ×3
SOL .9 NS 3000ML IRR  AL (IV SOLUTION) ×2
SOL .9 NS 3000ML IRR UROMATIC (IV SOLUTION) ×1 IMPLANT
STAPLER SKIN PROX 35W (STAPLE) ×3 IMPLANT
SUCTION FRAZIER HANDLE 10FR (MISCELLANEOUS) ×2
SUCTION TUBE FRAZIER 10FR DISP (MISCELLANEOUS) ×1 IMPLANT
SUT VIC AB 0 CT1 36 (SUTURE) ×6 IMPLANT
SUT VIC AB 2-0 CT1 27 (SUTURE) ×4
SUT VIC AB 2-0 CT1 TAPERPNT 27 (SUTURE) ×2 IMPLANT
SYR 20CC LL (SYRINGE) ×3 IMPLANT
SYR 30ML LL (SYRINGE) ×9 IMPLANT
SYRINGE 10CC LL (SYRINGE) ×3 IMPLANT
TIP FAN IRRIG PULSAVAC PLUS (DISPOSABLE) ×1 IMPLANT
WRAPON POLAR PAD KNEE (MISCELLANEOUS) ×3

## 2017-11-11 NOTE — Op Note (Signed)
11/11/2017  10:02 AM  Patient:   Krista Allen  Pre-Op Diagnosis:   Degenerative joint disease, right knee.  Post-Op Diagnosis:   Same  Procedure:   Right TKA using all-cemented Biomet Vanguard system with a 62.5 mm PCR femur, a 67 mm tibial tray with a 10 mm E-poly insert, and a 31 x 8 mm all-poly 3-pegged domed patella.  Surgeon:   Maryagnes AmosJ. Jeffrey Poggi, MD  Assistant:   Horris LatinoLance McGhee, PA-C; Marga HootsNicole Stephens, PA-S  Anesthesia:   Spinal  Findings:   As above  Complications:   None  EBL:   10 cc  Fluids:   1300 cc crystalloid  UOP:   700 cc  TT:   95 minutes at 300 mmHg  Drains:   None  Closure:   Staples  Implants:   As above  Brief Clinical Note:   The patient is a 51 year old female with a long history of progressively worsening right knee pain. The patient's symptoms have progressed despite medications, activity modification, injections, etc. The patient's history and examination were consistent with advanced degenerative joint disease of the right knee confirmed by plain radiographs. The patient presents at this time for a right total knee arthroplasty.  Procedure:   The patient was brought into the operating room. After adequate spinal anesthesia was obtained, the patient was lain in the supine position. The right lower extremity was prepped with ChloraPrep solution and draped sterilely. Preoperative antibiotics were administered. After verifying the proper laterality with a surgical timeout, the limb was exsanguinated with an Esmarch and the tourniquet inflated to 300 mmHg. A standard anterior approach to the knee was made through an approximately 7 inch incision. The incision was carried down through the subcutaneous tissues to expose superficial retinaculum. This was split the length of the incision and the medial flap elevated sufficiently to expose the medial retinaculum. The medial retinaculum was incised, leaving a 3-4 mm cuff of tissue on the patella. This was  extended distally along the medial border of the patellar tendon and proximally through the medial third of the quadriceps tendon. A subtotal fat pad excision was performed before the soft tissues were elevated off the anteromedial and anterolateral aspects of the proximal tibia to the level of the collateral ligaments. The anterior portions of the medial and lateral menisci were removed, as was the anterior cruciate ligament. With the knee flexed to 90, the external tibial guide was positioned and the appropriate proximal tibial cut made. This piece was taken to the back table where it was measured and found to be optimally replicated by a 67 mm component.  Attention was directed to the distal femur. The intramedullary canal was accessed through a 3/8" drill hole. The intramedullary guide was inserted and position in order to obtain a neutral flexion gap. The intercondylar block was positioned with care taken to avoid notching the anterior cortex of the femur. The appropriate cut was made. Next, the distal cutting block was placed at 6 of valgus alignment. Using the 9 mm slot, the distal cut was made. The distal femur was measured and found to be optimally replicated by the 62.5 mm component. The 62.5 mm 4-in-1 cutting block was positioned and first the posterior, then the posterior chamfer, the anterior chamfer, femoral and intercondylar cuts were made. At this point, the posterior portions medial and lateral menisci were removed. A trial reduction was performed using the appropriate femoral and tibial components with the 10 mm insert. This demonstrated excellent stability to varus  and valgus stressing both in flexion and extension while permitting full extension. Patella tracking was assessed and found to be excellent. Therefore, the tibial guide position was marked on the proximal tibia. The patella thickness was measured and found to be 20 mm. Therefore, the appropriate cut was made. The patellar surface  was measured and found to be optimally replicated by the 31 mm component. The three peg holes were drilled in place before the trial button was inserted. Patella tracking was assessed and found to be excellent, passing the "no thumb test". The lug holes were drilled into the distal femur before the trial component was removed, leaving only the tibial tray. The keel was then created using the appropriate tower, reamer, and punch.  The bony surfaces were prepared for cementing by irrigating thoroughly with bacitracin saline solution. A bone plug was fashioned from some of the bone that had been removed previously and used to plug the distal femoral canal. In addition, 20 cc of Exparel diluted out to 60 cc with normal saline and 30 cc of 0.5% Sensorcaine were injected into the postero-medial and postero-lateral aspects of the knee, the medial and lateral gutter regions, and the peri-incisional tissues to help with postoperative analgesia. Meanwhile, the cement was being mixed on the back table. When it was ready, the tibial tray was cemented in first. The excess cement was removed using Personal assistantreer elevators. Next, the femoral component was impacted into place. Again, the excess cement was removed using Personal assistantreer elevators. The 10 mm trial insert was positioned and the knee brought into extension while the cement hardened. Finally, the patella was cemented into place and secured using the patellar clamp. Again, the excess cement was removed using Personal assistantreer elevators. Once the cement had hardened, the knee was placed through a range of motion with the findings as described above. Therefore, the trial insert was removed and, after verifying that no cement had been retained posteriorly, the permanent insert was positioned and secured using the appropriate key locking mechanism. Again the knee was placed through a range of motion with the findings as described above.  The wound was copiously irrigated with bacitracin saline solution  using the jet lavage system before the quadriceps tendon and retinacular layer were reapproximated using #0 Vicryl interrupted sutures. The superficial retinacular layer also was closed using a running #0 Vicryl suture. A total of 10 cc of transexemic acid (TXA) was injected intra-articularly before the subcutaneous tissues were closed in several layers using 2-0 Vicryl interrupted sutures. The skin was closed using staples. A sterile honeycomb dressing was applied to the skin before the leg was wrapped with an Ace wrap to accommodate the polar pack. Once the dressing was on, a quick in and out catheterization was done to empty the patient's bladder, given that she had spinal anesthesia. 700 cc of urine was obtained. The patient was then awakened and returned to the recovery room in satisfactory condition after tolerating the procedure well.

## 2017-11-11 NOTE — Progress Notes (Signed)
PT Cancellation Note  Patient Details Name: Krista Allen MRN: [id number] DOB: 12/10/1966   Cancelled Treatment:    Reason Eval/Treat Not Completed: Pain limiting ability to participate.  PT consult received.  Chart reviewed.  Pt reporting already walking to bathroom with nursing after surgery today (nursing confirmed this; RW in room already).  Pt reporting 7/10 R knee pain "to the bone".  Pt preferring not to get up d/t pain and already recently walking to bathroom with nursing.  Per discussion with pt (d/t elevated pain), will hold PT at this time and re-attempt PT eval tomorrow morning.  Hendricks LimesEmily Elizabelle Fite, PT 11/11/17, 3:54 PM [phone]

## 2017-11-11 NOTE — Transfer of Care (Signed)
Immediate Anesthesia Transfer of Care Note  Patient: Krista Allen  Procedure(s) Performed: TOTAL KNEE ARTHROPLASTY (Right Knee)  Patient Location: PACU  Anesthesia Type:Regional and Spinal  Level of Consciousness: awake  Airway & Oxygen Therapy: Patient Spontanous Breathing  Post-op Assessment: Report given to RN  Post vital signs: stable  Last Vitals:  Vitals:   11/11/17 0610 11/11/17 1008  BP: 135/88 103/75  Pulse: 85 (!) 107  Resp: 16 18  Temp: 36.6 C 36.5 C  SpO2: 99% 96%    Last Pain:  Vitals:   11/11/17 0610  TempSrc: Oral         Complications: No apparent anesthesia complications

## 2017-11-11 NOTE — Anesthesia Preprocedure Evaluation (Signed)
Anesthesia Evaluation  Patient identified by MRN, date of birth, ID band Patient awake    Reviewed: Allergy & Precautions, NPO status , Patient's Chart, lab work & pertinent test results  History of Anesthesia Complications Negative for: history of anesthetic complications  Airway Mallampati: III  TM Distance: >3 FB Neck ROM: Full    Dental  (+) Partial Upper   Pulmonary neg sleep apnea, neg COPD, former smoker,    breath sounds clear to auscultation- rhonchi (-) wheezing      Cardiovascular Exercise Tolerance: Good (-) hypertension(-) CAD and (-) Past MI  Rhythm:Regular Rate:Normal - Systolic murmurs and - Diastolic murmurs    Neuro/Psych  Headaches, negative psych ROS   GI/Hepatic negative GI ROS, Neg liver ROS,   Endo/Other  negative endocrine ROSneg diabetes  Renal/GU negative Renal ROS     Musculoskeletal  (+) Arthritis ,   Abdominal (+) - obese,   Peds  Hematology negative hematology ROS (+)   Anesthesia Other Findings Past Medical History: No date: Arthritis No date: Diverticulitis No date: Headache     Comment:  MIGRAINES   Reproductive/Obstetrics                             Lab Results  Component Value Date   WBC 5.4 11/01/2017   HGB 14.4 11/01/2017   HCT 42.6 11/01/2017   MCV 95.4 11/01/2017   PLT 256 11/01/2017    Anesthesia Physical Anesthesia Plan  ASA: II  Anesthesia Plan: Spinal   Post-op Pain Management:    Induction:   PONV Risk Score and Plan: 2 and Propofol infusion and Ondansetron  Airway Management Planned: Natural Airway  Additional Equipment:   Intra-op Plan:   Post-operative Plan:   Informed Consent: I have reviewed the patients History and Physical, chart, labs and discussed the procedure including the risks, benefits and alternatives for the proposed anesthesia with the patient or authorized representative who has indicated his/her  understanding and acceptance.   Dental advisory given  Plan Discussed with: CRNA and Anesthesiologist  Anesthesia Plan Comments:         Anesthesia Quick Evaluation

## 2017-11-11 NOTE — Anesthesia Post-op Follow-up Note (Signed)
Anesthesia QCDR form completed.        

## 2017-11-11 NOTE — H&P (Signed)
Paper H&P to be scanned into permanent record. H&P reviewed and patient re-examined. No changes. 

## 2017-11-11 NOTE — Anesthesia Procedure Notes (Addendum)
Spinal  Patient location during procedure: OR Start time: 11/11/2017 7:40 AM End time: 11/11/2017 7:45 AM Staffing Anesthesiologist: Emmie Niemann, MD Performed: anesthesiologist  Preanesthetic Checklist Completed: patient identified, site marked, surgical consent, pre-op evaluation, timeout performed, IV checked, risks and benefits discussed and monitors and equipment checked Spinal Block Patient position: sitting Prep: ChloraPrep Patient monitoring: heart rate, continuous pulse ox, blood pressure and cardiac monitor Approach: midline Location: L4-5 Injection technique: single-shot Needle Needle type: Introducer and Pencil-Tip  Needle gauge: 24 G Needle length: 9 cm Additional Notes Negative paresthesia. Negative blood return. Positive free-flowing CSF. Expiration date of kit checked and confirmed. Patient tolerated procedure well, without complications.

## 2017-11-11 NOTE — [place] FL2 (Signed)
[place] MEDICAID FL2 LEVEL OF CARE SCREENING TOOL     IDENTIFICATION  Patient Name: Krista Allen Thelen Birthdate: 09/14/1966 Sex: female Admission Date (Current Location): 11/11/2017  [place]   Facility and Address:  [facility], [address], [place], [place] [address]      Provider Number: [id number]  Attending Physician Name and Address:  Christena FlakePoggi, John J, MD  Relative Name and Phone Number:       Current Level of Care: Hospital Recommended Level of Care: Skilled Nursing Facility Prior Approval Number:    Date Approved/Denied:   PASRR Number: [id number])  Discharge Plan: SNF    Current Diagnoses: Patient Active Problem List   Diagnosis Date Noted  . Status post total knee replacement using cement, right 11/11/2017    Orientation RESPIRATION BLADDER Height & Weight     Self, Time, Situation, Place  Normal Continent Weight: 150 lb (68 kg) Height:  5\' 2"  (157.5 cm)  BEHAVIORAL SYMPTOMS/MOOD NEUROLOGICAL BOWEL NUTRITION STATUS      Continent Diet(Regular)  AMBULATORY STATUS COMMUNICATION OF NEEDS Skin   Extensive Assist Verbally Surgical wounds(Incision Right Knee)                       Personal Care Assistance Level of Assistance  Bathing, Feeding, Dressing Bathing Assistance: Limited assistance Feeding assistance: Independent Dressing Assistance: Limited assistance     Functional Limitations Info  Sight, Hearing, Speech Sight Info: Adequate Hearing Info: Adequate Speech Info: Adequate    SPECIAL CARE FACTORS FREQUENCY  PT (By licensed PT), OT (By licensed OT)     PT Frequency: (5) OT Frequency: (5)            Contractures      Additional Factors Info  Code Status, Allergies Code Status Info: (Full Code) Allergies Info: (No Known Allergies)           Current Medications (11/11/2017):  This is the current hospital active medication list Current Facility-Administered  Medications  Medication Dose Route Frequency Provider Last Rate Last Dose  . acetaminophen (TYLENOL) tablet 650 mg  650 mg Oral Q4H PRN Poggi, Excell SeltzerJohn J, MD       Or  . acetaminophen (TYLENOL) suppository 650 mg  650 mg Rectal Q4H PRN Poggi, Excell SeltzerJohn J, MD      . acetaminophen (TYLENOL) tablet 1,000 mg  1,000 mg Oral Q6H Poggi, Excell SeltzerJohn J, MD   1,000 mg at 11/11/17 1427  . ALPRAZolam Prudy Feeler(XANAX) tablet 0.5 mg  0.5 mg Oral Daily PRN Poggi, Excell SeltzerJohn J, MD      . bisacodyl (DULCOLAX) suppository 10 mg  10 mg Rectal Daily PRN Poggi, Excell SeltzerJohn J, MD      . ceFAZolin (ANCEF) 2 g in dextrose 5 % 100 mL IVPB  2 g Intravenous Q6H Poggi, Excell SeltzerJohn J, MD 200 mL/hr at 11/11/17 1509 2 g at 11/11/17 1509  . ceFAZolin (ANCEF) 2-4 GM/100ML-% IVPB           . dextrose 5 % and 0.9 % NaCl with KCl 20 mEq/L infusion   Intravenous Continuous Poggi, Excell SeltzerJohn J, MD 100 mL/hr at 11/11/17 1429    . diphenhydrAMINE (BENADRYL) 12.5 MG/5ML elixir 12.5-25 mg  12.5-25 mg Oral Q4H PRN Poggi, Excell SeltzerJohn J, MD      . docusate sodium (COLACE) capsule 100 mg  100 mg Oral BID Poggi, Excell SeltzerJohn J, MD   100 mg at 11/11/17 1427  . [START ON 11/12/2017] enoxaparin (LOVENOX) injection  40 mg  40 mg Subcutaneous Q24H Poggi, Excell SeltzerJohn J, MD      . HYDROmorphone (DILAUDID) injection 1-2 mg  1-2 mg Intravenous Q2H PRN Poggi, Excell SeltzerJohn J, MD      . ketorolac (TORADOL) 15 MG/ML injection 15 mg  15 mg Intravenous Q6H Poggi, Excell SeltzerJohn J, MD   15 mg at 11/11/17 1428  . magnesium hydroxide (MILK OF MAGNESIA) suspension 30 mL  30 mL Oral Daily PRN Poggi, Excell SeltzerJohn J, MD      . metoCLOPramide (REGLAN) tablet 5-10 mg  5-10 mg Oral Q8H PRN Poggi, Excell SeltzerJohn J, MD       Or  . metoCLOPramide (REGLAN) injection 5-10 mg  5-10 mg Intravenous Q8H PRN Poggi, Excell SeltzerJohn J, MD      . ondansetron (ZOFRAN) tablet 4 mg  4 mg Oral Q6H PRN Poggi, Excell SeltzerJohn J, MD       Or  . ondansetron (ZOFRAN) injection 4 mg  4 mg Intravenous Q6H PRN Poggi, Excell SeltzerJohn J, MD   4 mg at 11/11/17 1428  . oxyCODONE (Oxy IR/ROXICODONE) immediate release tablet 10 mg  10  mg Oral Q3H PRN Poggi, Excell SeltzerJohn J, MD   10 mg at 11/11/17 1513  . oxyCODONE (Oxy IR/ROXICODONE) immediate release tablet 5 mg  5 mg Oral Q3H PRN Poggi, Excell SeltzerJohn J, MD   5 mg at 11/11/17 1427  . pantoprazole (PROTONIX) EC tablet 40 mg  40 mg Oral Daily Poggi, Excell SeltzerJohn J, MD   40 mg at 11/11/17 1428  . sodium phosphate (FLEET) 7-19 GM/118ML enema 1 enema  1 enema Rectal Once PRN Poggi, Excell SeltzerJohn J, MD      . venlafaxine XR (EFFEXOR-XR) 24 hr capsule 225 mg  225 mg Oral Q breakfast Poggi, Excell SeltzerJohn J, MD         Discharge Medications: Please see discharge summary for a list of discharge medications.  Relevant Imaging Results:  Relevant Lab Results:   Additional Information (SSN: [id number])  Payton SparkAnanda A Trezure Cronk, Student-Social Work

## 2017-11-11 NOTE — Plan of Care (Signed)
  Progressing Education: Knowledge of General Education information will improve 11/11/2017 1838 - Progressing by Cordie Griceodriguez, Winnie Umali A, RN Health Behavior/Discharge Planning: Ability to manage health-related needs will improve 11/11/2017 1838 - Progressing by Cordie Griceodriguez, Ali Mclaurin A, RN Clinical Measurements: Ability to maintain clinical measurements within normal limits will improve 11/11/2017 1838 - Progressing by Cordie Griceodriguez, Balraj Brayfield A, RN Will remain free from infection 11/11/2017 1838 - Progressing by Cordie Griceodriguez, Hermon Zea A, RN Diagnostic test results will improve 11/11/2017 1838 - Progressing by Cordie Griceodriguez, Voncille Simm A, RN Respiratory complications will improve 11/11/2017 1838 - Progressing by Cordie Griceodriguez, Ajit Errico A, RN Cardiovascular complication will be avoided 11/11/2017 1838 - Progressing by Cordie Griceodriguez, Avalene Sealy A, RN Activity: Risk for activity intolerance will decrease 11/11/2017 1838 - Progressing by Cordie Griceodriguez, Tacie Mccuistion A, RN Nutrition: Adequate nutrition will be maintained 11/11/2017 1838 - Progressing by Cordie Griceodriguez, Altovise Wahler A, RN Coping: Level of anxiety will decrease 11/11/2017 1838 - Progressing by Cordie Griceodriguez, Jeff Frieden A, RN Elimination: Will not experience complications related to bowel motility 11/11/2017 1838 - Progressing by Cordie Griceodriguez, Khai Torbert A, RN Will not experience complications related to urinary retention 11/11/2017 1838 - Progressing by Cordie Griceodriguez, Nikaela Coyne A, RN Pain Managment: General experience of comfort will improve 11/11/2017 1838 - Progressing by Cordie Griceodriguez, Holland Nickson A, RN Safety: Ability to remain free from injury will improve 11/11/2017 1838 - Progressing by Cordie Griceodriguez, Lilleigh Hechavarria A, RN Skin Integrity: Risk for impaired skin integrity will decrease 11/11/2017 1838 - Progressing by Cordie Griceodriguez, Ruhi Kopke A, RN Education: Knowledge of the prescribed therapeutic regimen will improve 11/11/2017 1838 - Progressing by Cordie Griceodriguez, Ashton Belote A, RN Activity: Ability to avoid complications of mobility  impairment will improve 11/11/2017 1838 - Progressing by Cordie Griceodriguez, Creek Gan A, RN Range of joint motion will improve 11/11/2017 1838 - Progressing by Cordie Griceodriguez, Chaney Ingram A, RN Clinical Measurements: Postoperative complications will be avoided or minimized 11/11/2017 1838 - Progressing by Cordie Griceodriguez, Adir Schicker A, RN Pain Management: Pain level will decrease with appropriate interventions 11/11/2017 1838 - Progressing by Cordie Griceodriguez, Kyheem Bathgate A, RN Skin Integrity: Signs of wound healing will improve 11/11/2017 1838 - Progressing by Cordie Griceodriguez, Ara Grandmaison A, RN

## 2017-11-12 ENCOUNTER — Encounter: Payer: Self-pay | Admitting: Surgery

## 2017-11-12 DIAGNOSIS — M1711 Unilateral primary osteoarthritis, right knee: Secondary | ICD-10-CM | POA: Diagnosis not present

## 2017-11-12 LAB — CBC WITH DIFFERENTIAL/PLATELET
BASOS PCT: 0 %
Basophils Absolute: 0 10*3/uL (ref 0–0.1)
EOS ABS: 0 10*3/uL (ref 0–0.7)
Eosinophils Relative: 0 %
HCT: 35.8 % (ref 35.0–47.0)
Hemoglobin: 12 g/dL (ref 12.0–16.0)
Lymphocytes Relative: 17 %
Lymphs Abs: 1.6 10*3/uL (ref 1.0–3.6)
MCH: 32.1 pg (ref 26.0–34.0)
MCHC: 33.4 g/dL (ref 32.0–36.0)
MCV: 96 fL (ref 80.0–100.0)
Monocytes Absolute: 0.9 10*3/uL (ref 0.2–0.9)
Monocytes Relative: 9 %
NEUTROS PCT: 74 %
Neutro Abs: 7.3 10*3/uL — ABNORMAL HIGH (ref 1.4–6.5)
PLATELETS: 211 10*3/uL (ref 150–440)
RBC: 3.73 MIL/uL — AB (ref 3.80–5.20)
RDW: 13 % (ref 11.5–14.5)
WBC: 9.8 10*3/uL (ref 3.6–11.0)

## 2017-11-12 LAB — BASIC METABOLIC PANEL
Anion gap: 6 (ref 5–15)
BUN: 15 mg/dL (ref 6–20)
CO2: 26 mmol/L (ref 22–32)
CREATININE: 1.02 mg/dL — AB (ref 0.44–1.00)
Calcium: 8.3 mg/dL — ABNORMAL LOW (ref 8.9–10.3)
Chloride: 105 mmol/L (ref 101–111)
GFR calc Af Amer: >60 mL/min (ref >60)
Glucose, Bld: 149 mg/dL — ABNORMAL HIGH (ref 65–99)
POTASSIUM: 4.2 mmol/L (ref 3.5–5.1)
SODIUM: 137 mmol/L (ref 135–145)

## 2017-11-12 MED ORDER — METAXALONE 400 MG PO TABS: 400.0000 mg | ORAL_TABLET | Freq: Three times a day (TID) | ORAL | 0 refills | Status: DC | PRN

## 2017-11-12 MED ORDER — OXYCODONE HCL 5 MG PO TABS: 5.0000 mg | ORAL_TABLET | ORAL | 0 refills | Status: DC | PRN

## 2017-11-12 MED ORDER — ONDANSETRON HCL 4 MG PO TABS: 4.0000 mg | ORAL_TABLET | Freq: Four times a day (QID) | ORAL | 0 refills | Status: DC | PRN

## 2017-11-12 MED ORDER — ENOXAPARIN SODIUM 40 MG/0.4ML ~~LOC~~ SOLN: 40.0000 mg | SUBCUTANEOUS | 0 refills | Status: DC

## 2017-11-12 NOTE — Evaluation (Signed)
Physical Therapy Evaluation Patient Details Name: Krista Allen MRN: [id number] DOB: October 28, 1966 Today's Date: 11/12/2017   History of Present Illness  Pt is a 51 y.o. female s/p R TKA secondary to DJD 11/11/17.  PMH includes HA's.  Clinical Impression  Prior to hospital admission, pt was independent and working.  Pt lives with her husband but plans to discharge to her mother's home that has ramped entry and handicap accessible bathroom.  Currently pt is independent with bed mobility, modified independent with transfers and ambulation 180 feet with RW, and SBA navigating 4 stairs with RW.  Pt's R knee ROM extension 4 degrees short of neutral and flexion 110 degrees AROM.  Pt would benefit from skilled PT to address noted impairments and functional limitations (see below for any additional details) during hospital stay.  Pt appears safe to discharge home today (in terms of functional mobility required for safe discharge).  Upon hospital discharge, recommend pt discharge to home with OP PT (pt reports already setting up OP PT with appointment on Monday).    Follow Up Recommendations Outpatient PT    Equipment Recommendations  Rolling walker with 5" wheels(youth sized)    Recommendations for Other Services       Precautions / Restrictions Precautions Precautions: Fall;Knee Precaution Booklet Issued: Yes (comment) Restrictions Weight Bearing Restrictions: Yes RLE Weight Bearing: Weight bearing as tolerated      Mobility  Bed Mobility Overal bed mobility: Independent             General bed mobility comments: Supine to/from sit without any difficulties  Transfers Overall transfer level: Modified independent Equipment used: Rolling walker (2 wheeled)             General transfer comment: sit to/from stand and stand step turn with RW steady and safe  Ambulation/Gait Ambulation/Gait assistance: Modified independent (Device/Increase time) Ambulation Distance (Feet): 180  Feet Assistive device: Rolling walker (2 wheeled)   Gait velocity: mildly decreased   General Gait Details: decreased stance time R LE; initial vc's for increasing UE support through RW and walker/gait technique but then no further cueing required  Stairs Stairs: Yes Stairs assistance: Supervision Stair Management: Step to pattern;Forwards;With walker Number of Stairs: 4 General stair comments: initial vc's for technique/stepping pattern and use of RW on stairs and then pt able to perform without any further cueing  Wheelchair Mobility    Modified Rankin (Stroke Patients Only)       Balance Overall balance assessment: Independent(pt able to stand at sink without UE support brushing her teeth)                                           Pertinent Vitals/Pain Pain Assessment: 0-10 Pain Score: 5  Pain Descriptors / Indicators: Aching;Sore;Tender Pain Intervention(s): Limited activity within patient's tolerance;Monitored during session;Premedicated before session;Repositioned;Ice applied  Vitals (HR and O2 on room air) stable and WFL throughout treatment session.    Home Living Family/patient expects to be discharged to:: Private residence Living Arrangements: Spouse/significant other(plans to discharge to mother's home (below is mother's home set-up)) Available Help at Discharge: Family Type of Home: House Home Access: Ramped entrance(pt's home has 4 STE no railing)     Home Layout: One level Home Equipment: Walker - 2 wheels;Walker - 4 wheels;Grab bars - tub/shower;Grab bars - toilet;Shower seat;Shower seat - built in;Bedside commode      Prior  Function Level of Independence: Independent               Hand Dominance        Extremity/Trunk Assessment   Upper Extremity Assessment Upper Extremity Assessment: Overall WFL for tasks assessed    Lower Extremity Assessment Lower Extremity Assessment: (L LE WFL; able to perform R LE SLR  independently; at least 3/5 R knee flexion/extension and DF AROM strength)    Cervical / Trunk Assessment Cervical / Trunk Assessment: Normal  Communication   Communication: No difficulties  Cognition Arousal/Alertness: Awake/alert Behavior During Therapy: WFL for tasks assessed/performed Overall Cognitive Status: Within Functional Limits for tasks assessed                                        General Comments   Nursing cleared pt for participation in physical therapy.  Pt agreeable to PT session.  [facility] TKA HEP handout provided and pt verbalizing and demonstrating appropriate technique and safety with ex's.  Pt educated on (via demonstration and hands on learning) how to manage polar care appropriately for home use.    Exercises Total Joint Exercises Ankle Circles/Pumps: AROM;Strengthening;Right;10 reps;Supine Quad Sets: AROM;Strengthening;Both;10 reps;Supine Gluteal Sets: AROM;Strengthening;Both;10 reps;Supine Short Arc Quad: AROM;Strengthening;Right;10 reps;Supine Heel Slides: AROM;Strengthening;Right;Supine(pt able to perform 1 rep on her own appropriately) Hip ABduction/ADduction: AROM;Strengthening;Right;10 reps;Supine Straight Leg Raises: AROM;Strengthening;Left;10 reps;Supine Long Arc Quad: AROM;Strengthening;Right;Seated(pt able to perform 1 rep on her own appropriately) Knee Flexion: AROM;Right;Seated(pt able to perform 1 rep on her own appropriately with 15 second hold) Goniometric ROM: R knee extension 4 degrees short of neutral semi-supine in bed; R knee flexion 110 degrees AROM sitting edge of bed   Assessment/Plan    PT Assessment Patient needs continued PT services  PT Problem List Decreased strength;Decreased range of motion;Decreased mobility;Decreased knowledge of precautions;Pain       PT Treatment Interventions DME instruction;Gait training;Stair training;Functional mobility training;Therapeutic activities;Therapeutic exercise;Balance  training;Patient/family education    PT Goals (Current goals can be found in the Care Plan section)  Acute Rehab PT Goals Patient Stated Goal: to go home with OP PT PT Goal Formulation: With patient Time For Goal Achievement: 11/26/17 Potential to Achieve Goals: Good    Frequency BID   Barriers to discharge        Co-evaluation               AM-PAC PT "6 Clicks" Daily Activity  Outcome Measure Difficulty turning over in bed (including adjusting bedclothes, sheets and blankets)?: None Difficulty moving from lying on back to sitting on the side of the bed? : None Difficulty sitting down on and standing up from a chair with arms (e.g., wheelchair, bedside commode, etc,.)?: None Help needed moving to and from a bed to chair (including a wheelchair)?: None Help needed walking in hospital room?: None Help needed climbing 3-5 steps with a railing? : A Little 6 Click Score: 23    End of Session Equipment Utilized During Treatment: Gait belt Activity Tolerance: Patient tolerated treatment well Patient left: in bed;with call bell/phone within reach;with bed alarm set;with SCD's reapplied(R heel elevated via towel roll; polar care in place and activated) Nurse Communication: Mobility status;Precautions;Weight bearing status PT Visit Diagnosis: Other abnormalities of gait and mobility (R26.89);Pain Pain - Right/Left: Right Pain - part of body: Knee    Time: [id number] PT Time Calculation (min) (ACUTE ONLY): 45 min   Charges:  PT Evaluation $PT Eval Low Complexity: 1 Low PT Treatments $Gait Training: 8-22 mins $Therapeutic Exercise: 8-22 mins   PT G Codes:   PT G-Codes **NOT FOR INPATIENT CLASS** Functional Assessment Tool Used: AM-PAC 6 Clicks Basic Mobility Functional Limitation: Mobility: Walking and moving around Mobility: Walking and Moving Around Current Status (Z6109(G8978): At least 1 percent but less than 20 percent impaired, limited or restricted Mobility: Walking and  Moving Around Goal Status [phone](G8979): 0 percent impaired, limited or restricted    Hendricks LimesEmily Hiroshi Krummel, PT 11/12/17, 11:01 AM [phone]

## 2017-11-12 NOTE — Care Management Note (Addendum)
Case Management Note  Patient Details  Name: Krista Allen MRN: [id number] Date of Birth: September 16, 1966  Subjective/Objective:                  RNCM met with patient to discuss transition of care.She states that her insurance has determined that she should discharge to home today with outpatient PT which she states has been arranged with [facility]. Her OPPT appointment is on Monday. Dr. Roland Rack has requested that Lovenox/generic okay 6m injection daily for 14 days-no refills called to patients pharmacy. [phone] Patient will be going to her mother's address and states her sisters will assist with transportation needs.   Action/Plan[place] [facility] [place] [facility] L[facility]ox [place] $10. Patient updated.   Expected Discharge Date:                  Expected Discharge Plan:     In-House Referral:     Discharge planning Services  Medication Assistance  Post Acute Care Choice:    Choice offered to:  Patient  DME Arranged:    DME Agency:     [facility] [place]    [place]     Status of Service:  Completed, signed off  If discussed at [facility] Meetings, dates discussed:    Additional Comments:  AMarshell Garfinkel RN 11/12/2017, 10:28 AM

## 2017-11-12 NOTE — Discharge Instructions (Signed)
Diet: As you were doing prior to hospitalization   Shower:  May shower but keep the wounds dry, use an occlusive plastic wrap, NO SOAKING IN TUB.  If the bandage gets wet, change with a clean dry gauze.  Dressing:  You may change your dressing as needed. Change the dressing with sterile gauze dressing.    Activity:  Increase activity slowly as tolerated, but follow the weight bearing instructions below.  No lifting or driving for 6 weeks.  Weight Bearing:   Weight bearing as tolerated to right lower extremity  Blood Clot Prevention:  Inject Lovenox daily for the next 14 days.  To prevent constipation: you may use a stool softener such as -  Colace (over the counter) 100 mg by mouth twice a day  Drink plenty of fluids (prune juice may be helpful) and high fiber foods Miralax (over the counter) for constipation as needed.    Itching:  If you experience itching with your medications, try taking only a single pain pill, or even half a pain pill at a time.  You may take up to 10 pain pills per day, and you can also use benadryl over the counter for itching or also to help with sleep.   Precautions:  If you experience chest pain or shortness of breath - call 911 immediately for transfer to the hospital emergency department!!  If you develop a fever greater that 101 F, purulent drainage from wound, increased redness or drainage from wound, or calf pain-Call [facility]                                              Follow- Up Appointment:  [facility]

## 2017-11-12 NOTE — Discharge Summary (Signed)
Physician Discharge Summary  Patient ID: Krista Allen Combes MRN: [id number] DOB/AGE: 52/02/1966 52 y.o.  Admit date: 11/11/2017 Discharge date: 11/12/2017  Admission Diagnoses:  PRIMARY OSTEOARTHRITIS OF RIGHT KNEE  Discharge Diagnoses: Patient Active Problem List   Diagnosis Date Noted  . 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 Arthritis   . Diverticulitis   . Headache    MIGRAINES     Transfusion: None.   Consultants (if any):   Discharged Condition: Improved  Hospital Course: Krista Allen Pollinger is an 51 y.o. female who was admitted 11/11/2017 with a diagnosis of right knee degenerative joint disease and went to the operating room on 11/11/2017 and underwent the above named procedures.    Surgeries: Procedure(s): TOTAL KNEE ARTHROPLASTY on 11/11/2017 Patient tolerated the surgery well. Taken to PACU where she was stabilized and then transferred to the orthopedic floor.  Started on Lovenox 40mg   q 24 hrs. Foot pumps applied bilaterally at 80 mm. Heels elevated on bed with rolled towels. No evidence of DVT. Negative Homan. Physical therapy started on day #1 for gait training and transfer. OT started day #1 for ADL and assisted devices.  Patient's IV removed POD1.  In and Out foley performed after surgery.  Implants: Right TKA using all-cemented Bi[employer] Vanguard system with a 62.5 mm PCR femur, a 67 mm tibial tray with a 10 mm E-poly insert, and a 31 x 8 mm all-poly 3-pegged domed patella.  She was given perioperative antibiotics:  Anti-infectives (From admission, onward)   Start     Dose/Rate Route Frequency Ordered Stop   11/11/17 1400  ceFAZolin (ANCEF) 2 g in dextrose 5 % 100 mL IVPB     2 g 200 mL/hr over 30 Minutes Intravenous Every 6 hours 11/11/17 1337 11/12/17 0226   11/11/17 1315  ceFAZolin (ANCEF) IVPB 2g/100 mL premix  Status:  Discontinued     2  g 200 mL/hr over 30 Minutes Intravenous Every 6 hours 11/11/17 1300 11/11/17 1337   11/11/17 0559  ceFAZolin (ANCEF) 2-4 GM/100ML-% IVPB    Comments:  Letta PateKinney, Nicole   : cabinet override      11/11/17 0559 11/11/17 1814   11/10/17 2215  ceFAZolin (ANCEF) IVPB 2g/100 mL premix     2 g 200 mL/hr over 30 Minutes Intravenous  Once 11/10/17 2210 11/11/17 0759    .  She was given sequential compression devices, early ambulation, and Lovenox for DVT prophylaxis.  She benefited maximally from the hospital stay and there were no complications.    Recent vital signs:  Vitals:   11/12/17 0342 11/12/17 0722  BP: 105/64 129/86  Pulse: 68 78  Resp: 18 16  Temp: 97.8 F (36.6 C) 97.8 F (36.6 C)  SpO2: 97% 99%    Recent laboratory studies:  Lab Results  Component Value Date   HGB 12.0 11/12/2017   HGB 14.4 11/01/2017   Lab Results  Component Value Date   WBC 9.8 11/12/2017   PLT 211 11/12/2017   Lab Results  Component Value Date   INR 0.89 11/01/2017   Lab Results  Component Value Date   NA 137 11/12/2017   K 4.2 11/12/2017   CL 105 11/12/2017   CO2 26 11/12/2017   BUN 15 11/12/2017   CREATININE 1.02 (H) 11/12/2017   GLUCOSE 149 (H) 11/12/2017    Discharge Medications:   Allergies as of 11/12/2017   No  Known Allergies     Medication List    TAKE these medications   ALPRAZolam 0.5 MG tablet Commonly known as:  XANAX Take 0.5 mg by mouth daily as needed for anxiety.   BLACK COHOSH PO Take 1 capsule by mouth daily.   cyanocobalamin 1000 MCG/ML injection Commonly known as:  (VITAMIN B-12) Inject 1,000 mcg into the muscle every 30 (thirty) days.   enoxaparin 40 MG/0.4ML injection Commonly known as:  LOVENOX Inject 0.4 mLs (40 mg total) daily into the skin.   ibuprofen 200 MG tablet Commonly known as:  ADVIL,MOTRIN Take 600 mg by mouth every 6 (six) hours as needed for headache or moderate pain.   metaxalone 400 MG tablet Commonly known as:  SKELAXIN Take  1 tablet (400 mg total) every 8 (eight) hours as needed by mouth for muscle spasms.   ondansetron 4 MG tablet Commonly known as:  ZOFRAN Take 1 tablet (4 mg total) every 6 (six) hours as needed by mouth for nausea.   oxyCODONE 5 MG immediate release tablet Commonly known as:  Oxy IR/ROXICODONE Take 1-2 tablets (5-10 mg total) every 4 (four) hours as needed by mouth for moderate pain or severe pain.   venlafaxine XR 150 MG 24 hr capsule Commonly known as:  EFFEXOR-XR Take 150 mg by mouth daily with breakfast.   venlafaxine XR 75 MG 24 hr capsule Commonly known as:  EFFEXOR-XR Take 75 mg by mouth daily with breakfast.       Diagnostic Studies: Dg Chest 2 View  Result Date: 11/01/2017 CLINICAL DATA:  Preoperative evaluation for RIGHT knee surgery on Thursday EXAM: CHEST  2 VIEW COMPARISON:  CT chest 03/01/2009, 08/24/2008, 05/11/2008 FINDINGS: Normal heart size, mediastinal contours, and pulmonary vascularity. Subsegmental atelectasis at LEFT base. Questionable nodular foci at LEFT base measuring 19 mm and 15 mm in sizes; these are seen on the PA view but not well localized on lateral view. Remaining lungs clear. No pleural effusion or pneumothorax. Minimal thoracic scoliosis. IMPRESSION: Questionable nodular foci at LEFT lung base measuring 19 mm and 15 mm in size. These correspond to noncalcified 19 mm and 12 mm diameter smoothly marginated noncalcified nodules in the LEFT lower lobe on the prior CT, the larger lesion stable, the smaller lesion minimally increased in size. The lack of significant interval change in size and appearance since 2009 makes these likely benign in etiology. Electronically Signed   By: Ulyses SouthwardMark  Boles M.D.   On: 11/01/2017 18:30   Dg Knee Right Port  Result Date: 11/11/2017 CLINICAL DATA:  Total knee replacement. EXAM: PORTABLE RIGHT KNEE - 1-2 VIEW COMPARISON:  MRI 09/27/2017 FINDINGS: Total knee arthroplasty that is well seated. No malalignment. Expected soft tissue  gas and swelling. IMPRESSION: Total knee arthroplasty without complicating feature. Electronically Signed   By: Marnee SpringJonathon  Watts M.D.   On: 11/11/2017 10:48   Disposition: Plan is for discharge home today with HHPT.  WIll continue Lovenox at home for DVT prophylaxis.  Follow-up Information    Anson OregonMcGhee, Rolando Whitby Lance, PA-C Follow up in 10 day(s).   Specialty:  Physician Assistant Why:  Mindi SlickerStaple removal. Contact information: [address] [facility] [place] [id number] [phone]          Signed: Meriel PicaJames L Cathalina Barcia PA-C 11/12/2017, 8:10 AM

## 2017-11-12 NOTE — Progress Notes (Signed)
Pt educated on discharge materials, verbalized understanding. Pt refused medications to assist in having a BM and MD notified. VSS. IV removed. Pt wheeled to visitor entrance by NT and assisted into sisters car.

## 2017-11-12 NOTE — Progress Notes (Signed)
Clinical Social Worker (CSW) received SNF consult. PT is recommending outpatient PT. RN case manager aware of above. Please reconsult if future social work needs arise. CSW signing off.   Lizzie An, LCSW [phone]  

## 2017-11-12 NOTE — Anesthesia Postprocedure Evaluation (Signed)
Anesthesia Post Note  Patient: Krista Allen  Procedure(s) Performed: TOTAL KNEE ARTHROPLASTY (Right Knee)  Patient location during evaluation: Other Anesthesia Type: Spinal Level of consciousness: oriented and awake and alert Pain management: pain level controlled Vital Signs Assessment: post-procedure vital signs reviewed and stable Respiratory status: spontaneous breathing, respiratory function stable and patient connected to nasal cannula oxygen Cardiovascular status: blood pressure returned to baseline and stable Postop Assessment: no headache, no backache and no apparent nausea or vomiting Anesthetic complications: no     Last Vitals:  Vitals:   11/12/17 0342 11/12/17 0722  BP: 105/64 129/86  Pulse: 68 78  Resp: 18 16  Temp: 36.6 C 36.6 C  SpO2: 97% 99%    Last Pain:  Vitals:   11/12/17 0722  TempSrc: Oral  PainSc:                  Starling Mannsurtis,  Alysia Scism A

## 2017-11-12 NOTE — Progress Notes (Signed)
Subjective: 1 Day Post-Op Procedure(s) (LRB): TOTAL KNEE ARTHROPLASTY (Right) Patient reports pain as mild.   Patient is well, and has had no acute complaints or problems Plan is to go home with HHPT after hospital stay. Negative for chest pain and shortness of breath Fever: no Gastrointestinal:Positive for nausea and vomiting  Objective: Vital signs in last 24 hours: Temp:  [97.5 F (36.4 C)-98.6 F (37 C)] 97.8 F (36.6 C) (11/16 0722) Pulse Rate:  [68-107] 78 (11/16 0722) Resp:  [12-20] 16 (11/16 0722) BP: (103-129)/(64-89) 129/86 (11/16 0722) SpO2:  [93 %-100 %] 99 % (11/16 0722) FiO2 (%):  [21 %] 21 % (11/15 1301)  Intake/Output from previous day:  Intake/Output Summary (Last 24 hours) at 11/12/2017 0804 Last data filed at 11/12/2017 0600 Gross per 24 hour  Intake 2871.67 ml  Output 1110 ml  Net 1761.67 ml    Intake/Output this shift: No intake/output data recorded.  Labs: Recent Labs    11/12/17 0329  HGB 12.0   Recent Labs    11/12/17 0329  WBC 9.8  RBC 3.73*  HCT 35.8  PLT 211   Recent Labs    11/12/17 0329  NA 137  K 4.2  CL 105  CO2 26  BUN 15  CREATININE 1.02*  GLUCOSE 149*  CALCIUM 8.3*   No results for input(s): LABPT, INR in the last 72 hours.   EXAM General - Patient is Alert, Appropriate and Oriented Extremity - ABD soft Sensation intact distally Intact pulses distally Dorsiflexion/Plantar flexion intact Incision: dressing C/D/I No cellulitis present Dressing/Incision - clean, dry, no drainage Motor Function - intact, moving foot and toes well on exam.   Abdomen soft with normal BS, no tympany.  Past Medical History:  Diagnosis Date  . Arthritis   . Diverticulitis   . Headache    MIGRAINES    Assessment/Plan: 1 Day Post-Op Procedure(s) (LRB): TOTAL KNEE ARTHROPLASTY (Right) Active Problems:   Status post total knee replacement using cement, right  Estimated body mass index is 27.44 kg/m as calculated from the  following:   Height as of this encounter: 5\' 2"  (1.575 m).   Weight as of this encounter: 68 kg (150 lb). Advance diet Up with therapy D/C IV fluids when tolerating po intake.  Labs reviewed. Up with therapy today. Zofran for nausea. Begin working on having a BM. Plan will be for possible discharge home this afternoon.  DVT Prophylaxis - Lovenox, Foot Pumps and TED hose Weight-Bearing as tolerated to right leg  J. Horris LatinoLance Oceanna Arruda, PA-C [facility] 11/12/2017, 8:04 AM

## 2017-11-13 ENCOUNTER — Emergency Department: Payer: 59

## 2017-11-13 ENCOUNTER — Emergency Department
Admission: EM | Admit: 2017-11-13 | Discharge: 2017-11-13 | Disposition: A | Discharge status: Home / Self Care | Payer: 59 | Attending: Emergency Medicine | Admitting: Emergency Medicine

## 2017-11-13 DIAGNOSIS — M25561 Pain in right knee: Secondary | ICD-10-CM

## 2017-11-13 DIAGNOSIS — Z87891 Personal history of nicotine dependence: Secondary | ICD-10-CM | POA: Insufficient documentation

## 2017-11-13 DIAGNOSIS — G8918 Other acute postprocedural pain: Secondary | ICD-10-CM | POA: Diagnosis present

## 2017-11-13 DIAGNOSIS — Z96651 Presence of right artificial knee joint: Secondary | ICD-10-CM | POA: Insufficient documentation

## 2017-11-13 DIAGNOSIS — I824Y1 Acute embolism and thrombosis of unspecified deep veins of right proximal lower extremity: Secondary | ICD-10-CM | POA: Insufficient documentation

## 2017-11-13 DIAGNOSIS — Z79899 Other long term (current) drug therapy: Secondary | ICD-10-CM | POA: Diagnosis not present

## 2017-11-13 LAB — COMPREHENSIVE METABOLIC PANEL
ALBUMIN: 3.8 g/dL (ref 3.5–5.0)
ALK PHOS: 65 U/L (ref 38–126)
ALT: 26 U/L (ref 14–54)
ANION GAP: 10 (ref 5–15)
AST: 32 U/L (ref 15–41)
BILIRUBIN TOTAL: 0.7 mg/dL (ref 0.3–1.2)
BUN: 12 mg/dL (ref 6–20)
CALCIUM: 8.9 mg/dL (ref 8.9–10.3)
CO2: 21 mmol/L — AB (ref 22–32)
CREATININE: 0.85 mg/dL (ref 0.44–1.00)
Chloride: 102 mmol/L (ref 101–111)
GFR calc Af Amer: >60 mL/min (ref >60)
GFR calc non Af Amer: >60 mL/min (ref >60)
GLUCOSE: 125 mg/dL — AB (ref 65–99)
Potassium: 4 mmol/L (ref 3.5–5.1)
SODIUM: 133 mmol/L — AB (ref 135–145)
TOTAL PROTEIN: 7 g/dL (ref 6.5–8.1)

## 2017-11-13 LAB — CBC WITH DIFFERENTIAL/PLATELET
BASOS PCT: 0 %
Basophils Absolute: 0 10*3/uL (ref 0–0.1)
EOS ABS: 0 10*3/uL (ref 0–0.7)
Eosinophils Relative: 0 %
HEMATOCRIT: 38.2 % (ref 35.0–47.0)
HEMOGLOBIN: 12.7 g/dL (ref 12.0–16.0)
LYMPHS ABS: 1.2 10*3/uL (ref 1.0–3.6)
Lymphocytes Relative: 13 %
MCH: 31.7 pg (ref 26.0–34.0)
MCHC: 33.3 g/dL (ref 32.0–36.0)
MCV: 95.2 fL (ref 80.0–100.0)
MONOS PCT: 8 %
Monocytes Absolute: 0.8 10*3/uL (ref 0.2–0.9)
NEUTROS ABS: 7.3 10*3/uL — AB (ref 1.4–6.5)
NEUTROS PCT: 79 %
Platelets: 245 10*3/uL (ref 150–440)
RBC: 4.02 MIL/uL (ref 3.80–5.20)
RDW: 13.3 % (ref 11.5–14.5)
WBC: 9.3 10*3/uL (ref 3.6–11.0)

## 2017-11-13 MED ORDER — TRAMADOL HCL 50 MG PO TABS: 100.0000 mg | ORAL_TABLET | Freq: Once | ORAL | Status: DC

## 2017-11-13 MED ORDER — RIVAROXABAN (XARELTO) VTE STARTER PACK (15 & 20 MG): ORAL_TABLET | ORAL | 0 refills | Status: DC

## 2017-11-13 MED ORDER — HYDROMORPHONE HCL 1 MG/ML IJ SOLN
1.0000 mg | Freq: Once | INTRAMUSCULAR | Status: AC
  Administered 2017-11-13: 1 mg via INTRAVENOUS
  Filled 2017-11-13: qty 1

## 2017-11-13 MED ORDER — MORPHINE SULFATE (PF) 4 MG/ML IV SOLN
4.0000 mg | Freq: Once | INTRAVENOUS | Status: AC
  Administered 2017-11-13: 4 mg via INTRAVENOUS
  Filled 2017-11-13: qty 1

## 2017-11-13 MED ORDER — TRAMADOL HCL 50 MG PO TABS: 50.0000 mg | ORAL_TABLET | ORAL | 0 refills | Status: DC | PRN

## 2017-11-13 MED ORDER — TRAMADOL HCL 50 MG PO TABS
100.0000 mg | ORAL_TABLET | Freq: Once | ORAL | Status: AC
Start: 2017-11-13 — End: 2017-11-13
  Administered 2017-11-13: 100 mg via ORAL
  Filled 2017-11-13: qty 2

## 2017-11-13 MED ORDER — PROMETHAZINE HCL 12.5 MG PO TABS: 12.5000 mg | ORAL_TABLET | Freq: Four times a day (QID) | ORAL | 0 refills | Status: DC | PRN

## 2017-11-13 MED ORDER — ONDANSETRON HCL 4 MG/2ML IJ SOLN
4.0000 mg | Freq: Once | INTRAMUSCULAR | Status: AC
  Administered 2017-11-13: 4 mg via INTRAVENOUS
  Filled 2017-11-13: qty 2

## 2017-11-13 MED ORDER — RIVAROXABAN 15 MG PO TABS
15.0000 mg | ORAL_TABLET | Freq: Once | ORAL | Status: AC
  Administered 2017-11-13: 15 mg via ORAL
  Filled 2017-11-13: qty 1

## 2017-11-13 NOTE — Discharge Instructions (Signed)
Please continue with compression stockings.  Start Xarelto.  Discontinue Lovenox.  Alternate oxycodone with tramadol as needed for pain.  You may take oxycodone 5-10 mg every 4 hours as needed for pain.  2 hours after taking oxycodone you may take tramadol 50-100 mg.  Please make sure you are continuing to take stool softeners to prevent constipation.  Call orthopedic office for any concerns or questions.  Call 911 or go to the emergency department immediately for any chest pain, shortness of breath.

## 2017-11-13 NOTE — ED Notes (Signed)
Patient reports she took 1 oxycodone and a muscle relaxer at 0500. Patient reports she has been taking medication regularly throughout the night without relief.

## 2017-11-13 NOTE — ED Notes (Signed)
Pt in US. Will be brought to room when done

## 2017-11-13 NOTE — ED Provider Notes (Signed)
[facility] EMERGENCY DEPARTMENT Provider Note   CSN: [id number] Arrival date & time: 11/13/17  4098     History   Chief Complaint Chief Complaint  Patient presents with  . Post-op Problem    right knee    HPI Krista Allen is a 51 y.o. female presents today for evaluation of right lower extremity pain.  She is 2 days postop from right total knee arthroplasty.  Patient was discharged home with Lovenox.  Patient states she had increased pain in her right knee this morning.  She denies any calf pain, chest pain, shortness of breath, difficulty breathing.  Patient states her pain is 10 out of 10 and she was taken oxycodone 10 mg every 4 hours as prescribed she is also taken her muscle relaxer as prescribed.  She denies any history of blood clots or pulmonary emboli.  No bleeding disorders.  Pain is constant.  She presents with husband who is the caretaker.  HPI  Past Medical History:  Diagnosis Date  . Arthritis   . Diverticulitis   . Headache    MIGRAINES    Patient Active Problem List   Diagnosis Date Noted  . 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 CHOLECYSTECTOMY    . COLONOSCOPY W/ POLYPECTOMY    . ENDOMETRIAL ABLATION    . FOOT SURGERY Right   . KNEE ARTHROSCOPY WITH MEDIAL  MENISCAL REPAIR Right 08/12/2017   Performed by Christena Flake, MD at [facility] ORS  . LAPAROSCOPIC SIGMOID COLECTOMY    . TOTAL KNEE ARTHROPLASTY Right 11/11/2017   Performed by Christena Flake, MD at [facility] ORS    OB History    No data available       Home Medications    Prior to Admission medications   Medication Sig Start Date End Date Taking? Authorizing Provider  ALPRAZolam Prudy Feeler) 0.5 MG tablet Take 0.5 mg by mouth daily as needed for anxiety.     [provider]  BLACK COHOSH PO Take 1 capsule by mouth daily.    [provider]  cyanocobalamin (,VITAMIN B-12,)  1000 MCG/ML injection Inject 1,000 mcg into the muscle every 30 (thirty) days.    [provider]  ibuprofen (ADVIL,MOTRIN) 200 MG tablet Take 600 mg by mouth every 6 (six) hours as needed for headache or moderate pain.     [provider]  metaxalone (SKELAXIN) 400 MG tablet Take 1 tablet (400 mg total) every 8 (eight) hours as needed by mouth for muscle spasms. 11/12/17   Anson Oregon, PA-C  ondansetron (ZOFRAN) 4 MG tablet Take 1 tablet (4 mg total) every 6 (six) hours as needed by mouth for nausea. 11/12/17   Anson Oregon, PA-C  oxyCODONE (OXY IR/ROXICODONE) 5 MG immediate release tablet Take 1-2 tablets (5-10 mg total) every 4 (four) hours as needed by mouth for moderate pain or severe pain. 11/12/17   Anson Oregon, PA-C  promethazine (PHENERGAN) 12.5 MG tablet Take 1 tablet (12.5 mg total) every 6 (six) hours as needed by mouth for nausea or vomiting. 11/13/17   Evon Slack, PA-C  Rivaroxaban 15 & 20 MG TBPK Take as directed on package: Start with one 15mg  tablet by mouth twice a day with food. On Day 22, switch to one 20mg  tablet once a day with food. 11/13/17   Evon Slack, PA-C  traMADol (ULTRAM) 50 MG tablet  Take 1-2 tablets (50-100 mg total) every 4 (four) hours as needed by mouth (no more than 8 tablets in 24 hr period). 11/13/17 11/13/18  Evon Slack, PA-C  venlafaxine XR (EFFEXOR-XR) 150 MG 24 hr capsule Take 150 mg by mouth daily with breakfast.    [provider]  venlafaxine XR (EFFEXOR-XR) 75 MG 24 hr capsule Take 75 mg by mouth daily with breakfast.    [provider]    Family History No family history on file.  Social History Social History   Tobacco Use  . Smoking status: Former Smoker    Packs/day: 1.00    Types: Cigarettes    Last attempt to quit: 08/09/2016    Years since quitting: 1.2  . Smokeless tobacco: Never Used  . Tobacco comment: PT UNSURE HOW LONG SHE SMOKED  Substance Use Topics  .  Alcohol use: Yes    Alcohol/week: 2.4 oz    Types: 4 Cans of beer per week    Comment: WEEKENDS  . Drug use: No     Allergies   Patient has no known allergies.   Review of Systems Review of Systems  Constitutional: Negative for activity change, chills, fatigue and fever.  HENT: Negative for congestion, sinus pressure and sore throat.   Eyes: Negative for visual disturbance.  Respiratory: Negative for cough, chest tightness and shortness of breath.   Cardiovascular: Negative for chest pain and leg swelling.  Gastrointestinal: Negative for abdominal pain, diarrhea, nausea and vomiting.  Genitourinary: Negative for dysuria.  Musculoskeletal: Positive for arthralgias, gait problem and joint swelling.  Skin: Negative for rash.  Neurological: Negative for weakness, numbness and headaches.  Hematological: Negative for adenopathy.  Psychiatric/Behavioral: Negative for agitation, behavioral problems and confusion.     Physical Exam Updated Vital Signs BP 112/79   Pulse 81   Temp 98 F (36.7 C)   Resp 18   Ht 5\' 2"  (1.575 m)   Wt 68 kg (150 lb)   LMP 09/09/2016 (Approximate)   SpO2 95%   BMI 27.44 kg/m   Physical Exam  Constitutional: She is oriented to person, place, and time. She appears well-developed and well-nourished.  HENT:  Head: Normocephalic and atraumatic.  Eyes: Conjunctivae are normal.  Neck: Normal range of motion.  Cardiovascular: Normal rate and regular rhythm.  Pulmonary/Chest: Effort normal. No respiratory distress. She has no wheezes. She has no rales. She exhibits no tenderness.  Abdominal: Soft.  Musculoskeletal:  Examination of the right lower extremity shows patient has moderate swelling throughout the right knee secondary to effusion.  She is nontender throughout the calf.  She does have increase in distal femur swelling that is slightly increased on the right when compared to the left side.  She has normal ankle plantar flexion and dorsiflexion  bilaterally.  There is no warmth or redness throughout the right lower extremity.  Incision site is intact with honeycomb dressing.  There is scant drainage with no purulence.  She is nontender throughout the proximal femur.  2+ dorsalis pedis pulses  Neurological: She is alert and oriented to person, place, and time. Coordination normal.  Skin: Skin is warm. No rash noted. No erythema.  Psychiatric: She has a normal mood and affect. Her behavior is normal. Thought content normal.     ED Treatments / Results  Labs (all labs ordered are listed, but only abnormal results are displayed) Labs Reviewed  CBC WITH DIFFERENTIAL/PLATELET - Abnormal; Notable for the following components:      Result  Value   Neutro Abs 7.3 (*)    All other components within normal limits  COMPREHENSIVE METABOLIC PANEL - Abnormal; Notable for the following components:   Sodium 133 (*)    CO2 21 (*)    Glucose, Bld 125 (*)    All other components within normal limits    EKG  EKG Interpretation None       Radiology [place] Venous Img Lower Unilateral Right  Result Date: 11/13/2017 CLINICAL DATA:  Pain EXAM: RIGHT LOWER EXTREMITY VENOUS DOPPLER ULTRASOUND TECHNIQUE: Gray-scale sonography with graded compression, as well as color Doppler and duplex ultrasound were performed to evaluate the lower extremity deep venous systems from the level of the common femoral vein and including the common femoral, femoral, profunda femoral, popliteal and calf veins including the posterior tibial, peroneal and gastrocnemius veins when visible. The superficial great saphenous vein was also interrogated. Spectral Doppler was utilized to evaluate flow at rest and with distal augmentation maneuvers in the common femoral, femoral and popliteal veins. COMPARISON:  None. FINDINGS: Contralateral Common Femoral Vein: Respiratory phasicity is normal and symmetric with the symptomatic side. No evidence of thrombus. Normal compressibility. Common  Femoral Vein: No evidence of thrombus. Normal compressibility, respiratory phasicity and response to augmentation. Saphenofemoral Junction: No evidence of thrombus. Normal compressibility and flow on color Doppler imaging. Profunda Femoral Vein: No evidence of thrombus. Normal compressibility and flow on color Doppler imaging. Femoral Vein: Nonocclusive thrombus noted in the femoral vein extending the length of the vein in the right thigh. Popliteal Vein: No evidence of thrombus. Normal compressibility, respiratory phasicity and response to augmentation. Calf Veins: No evidence of thrombus. Normal compressibility and flow on color Doppler imaging. Superficial Great Saphenous Vein: No evidence of thrombus. Normal compressibility. Venous Reflux:  None. Other Findings:  None. IMPRESSION: Nonocclusive thrombus throughout the right femoral vein. Electronically Signed   By: Charlett NoseKevin  Dover M.D.   On: 11/13/2017 07:34   Dg Knee Right Port  Result Date: 11/11/2017 CLINICAL DATA:  Total knee replacement. EXAM: PORTABLE RIGHT KNEE - 1-2 VIEW COMPARISON:  MRI 09/27/2017 FINDINGS: Total knee arthroplasty that is well seated. No malalignment. Expected soft tissue gas and swelling. IMPRESSION: Total knee arthroplasty without complicating feature. Electronically Signed   By: Marnee SpringJonathon  Watts M.D.   On: 11/11/2017 10:48    Procedures Procedures (including critical care time)  Medications Ordered in ED Medications  morphine 4 MG/ML injection 4 mg (4 mg Intravenous Given 11/13/17 0643)  ondansetron (ZOFRAN) injection 4 mg (4 mg Intravenous Given 11/13/17 0643)  HYDROmorphone (DILAUDID) injection 1 mg (1 mg Intravenous Given 11/13/17 0753)  Rivaroxaban (XARELTO) tablet 15 mg (15 mg Oral Given 11/13/17 0839)  traMADol (ULTRAM) tablet 100 mg (100 mg Oral Given 11/13/17 0839)     Initial Impression / Assessment and Plan / ED Course  I have reviewed the triage vital signs and the nursing notes.  Pertinent labs &  imaging results that were available during my care of the patient were reviewed by me and considered in my medical decision making (see chart for details).     51 year old female with nonocclusive right lower extremity DVT 2 days status post right total knee arthroplasty.  Discussed with orthopedic surgeon.  Will discontinue Lovenox and start Xarelto 15 mg twice daily times 21 days then 20 mg daily.  She will notify PCP Dr. Judithann SheenSparks Monday of DVT.  Patient will continue with compression stockings.  She will alternate oxycodone with tramadol.  Prescription for tramadol was given today.  She is also given a prescription for Phenergan for nausea she will use this as prescribed.  She is educated on signs and symptoms to call 911 or return to the emergency department for.  Final Clinical Impressions(s) / ED Diagnoses   Final diagnoses:  Status post total right knee replacement  Acute deep vein thrombosis (DVT) of proximal end of right lower extremity [facility](HCC)    ED Discharge Orders        Ordered    traMADol (ULTRAM) 50 MG tablet  Every 4 hours PRN     11/13/17 0851    promethazine (PHENERGAN) 12.5 MG tablet  Every 6 hours PRN     11/13/17 0851    Rivaroxaban 15 & 20 MG TBPK     11/13/17 0851       Evon SlackGaines, Brookelyn Gaynor C, PA-C 11/13/17 0902    Evon SlackGaines, Kathline Banbury C, PA-C 11/13/17 0902    Myrna BlazerSchaevitz, David Matthew, MD 11/13/17 [phone]

## 2017-11-13 NOTE — ED Triage Notes (Signed)
Patient c/o right knee pain following open total knee replacement on Thursday. Patient reports increasing/severe pain beginning last night.

## 2017-12-08 ENCOUNTER — Ambulatory Visit
Admission: RE | Admit: 2017-12-08 | Discharge: 2017-12-08 | Disposition: A | Discharge status: Home / Self Care | Payer: 59 | Source: Ambulatory Visit | Attending: Internal Medicine | Admitting: Internal Medicine

## 2017-12-08 ENCOUNTER — Other Ambulatory Visit: Payer: Self-pay | Admitting: Internal Medicine

## 2017-12-08 DIAGNOSIS — R51 Headache: Principal | ICD-10-CM

## 2017-12-08 DIAGNOSIS — G8929 Other chronic pain: Secondary | ICD-10-CM

## 2017-12-08 DIAGNOSIS — R519 Headache, unspecified: Secondary | ICD-10-CM

## 2018-01-27 ENCOUNTER — Encounter: Payer: Self-pay | Admitting: *Deleted

## 2018-01-27 ENCOUNTER — Other Ambulatory Visit: Payer: Self-pay

## 2018-02-02 ENCOUNTER — Ambulatory Visit
Admission: RE | Admit: 2018-02-02 | Discharge: 2018-02-02 | Disposition: A | Discharge status: Home / Self Care | Payer: 59 | Source: Ambulatory Visit | Attending: Surgery | Admitting: Surgery

## 2018-02-02 ENCOUNTER — Ambulatory Visit: Payer: 59 | Admitting: Anesthesiology

## 2018-02-02 ENCOUNTER — Encounter
Admission: RE | Disposition: A | Discharge status: Home / Self Care | Payer: Self-pay | Source: Ambulatory Visit | Attending: Surgery

## 2018-02-02 DIAGNOSIS — Z79899 Other long term (current) drug therapy: Secondary | ICD-10-CM | POA: Insufficient documentation

## 2018-02-02 DIAGNOSIS — Z9889 Other specified postprocedural states: Secondary | ICD-10-CM | POA: Diagnosis not present

## 2018-02-02 DIAGNOSIS — Z87891 Personal history of nicotine dependence: Secondary | ICD-10-CM | POA: Insufficient documentation

## 2018-02-02 DIAGNOSIS — G43909 Migraine, unspecified, not intractable, without status migrainosus: Secondary | ICD-10-CM | POA: Diagnosis not present

## 2018-02-02 DIAGNOSIS — T3111 Burns involving 10-19% of body surface with 10-19% third degree burns: Secondary | ICD-10-CM | POA: Diagnosis not present

## 2018-02-02 DIAGNOSIS — M25661 Stiffness of right knee, not elsewhere classified: Secondary | ICD-10-CM | POA: Diagnosis present

## 2018-02-02 DIAGNOSIS — Z7901 Long term (current) use of anticoagulants: Secondary | ICD-10-CM | POA: Insufficient documentation

## 2018-02-02 DIAGNOSIS — X030XXA Exposure to flames in controlled fire, not in building or structure, initial encounter: Secondary | ICD-10-CM | POA: Diagnosis not present

## 2018-02-02 DIAGNOSIS — Z9049 Acquired absence of other specified parts of digestive tract: Secondary | ICD-10-CM | POA: Insufficient documentation

## 2018-02-02 DIAGNOSIS — T24201A Burn of second degree of unspecified site of right lower limb, except ankle and foot, initial encounter: Secondary | ICD-10-CM | POA: Insufficient documentation

## 2018-02-02 DIAGNOSIS — Z96651 Presence of right artificial knee joint: Secondary | ICD-10-CM | POA: Insufficient documentation

## 2018-02-02 DIAGNOSIS — Z8249 Family history of ischemic heart disease and other diseases of the circulatory system: Secondary | ICD-10-CM | POA: Diagnosis not present

## 2018-02-02 DIAGNOSIS — E538 Deficiency of other specified B group vitamins: Secondary | ICD-10-CM | POA: Insufficient documentation

## 2018-02-02 DIAGNOSIS — M1990 Unspecified osteoarthritis, unspecified site: Secondary | ICD-10-CM | POA: Diagnosis not present

## 2018-02-02 DIAGNOSIS — Z8042 Family history of malignant neoplasm of prostate: Secondary | ICD-10-CM | POA: Diagnosis not present

## 2018-02-02 DIAGNOSIS — Z801 Family history of malignant neoplasm of trachea, bronchus and lung: Secondary | ICD-10-CM | POA: Diagnosis not present

## 2018-02-02 DIAGNOSIS — Z823 Family history of stroke: Secondary | ICD-10-CM | POA: Diagnosis not present

## 2018-02-02 HISTORY — DX: Burn of unspecified body region, unspecified degree: T30.0

## 2018-02-02 HISTORY — PX: EXAM UNDER ANESTHESIA WITH MANIPULATION OF KNEE: SHX5816

## 2018-02-02 HISTORY — DX: Family history of other specified conditions: Z84.89

## 2018-02-02 SURGERY — MANIPULATION, JOINT, KNEE, WITH ANESTHESIA
Anesthesia: General | Laterality: Right | Wound class: Clean Contaminated

## 2018-02-02 MED ORDER — METOCLOPRAMIDE HCL 5 MG/ML IJ SOLN: 5.0000 mg | Freq: Three times a day (TID) | INTRAMUSCULAR | Status: DC | PRN

## 2018-02-02 MED ORDER — POTASSIUM CHLORIDE IN NACL 20-0.9 MEQ/L-% IV SOLN: INTRAVENOUS | Status: DC

## 2018-02-02 MED ORDER — OXYCODONE HCL 5 MG PO TABS
5.0000 mg | ORAL_TABLET | Freq: Once | ORAL | Status: AC | PRN
  Administered 2018-02-02: 5 mg via ORAL

## 2018-02-02 MED ORDER — ONDANSETRON HCL 4 MG PO TABS: 4.0000 mg | ORAL_TABLET | Freq: Four times a day (QID) | ORAL | Status: DC | PRN

## 2018-02-02 MED ORDER — LACTATED RINGERS IV SOLN
INTRAVENOUS | Status: DC
  Administered 2018-02-02: 11:00:00 via INTRAVENOUS

## 2018-02-02 MED ORDER — DEXMEDETOMIDINE HCL 200 MCG/2ML IV SOLN
INTRAVENOUS | Status: DC | PRN
  Administered 2018-02-02: 8 ug via INTRAVENOUS

## 2018-02-02 MED ORDER — OXYCODONE HCL 5 MG PO TABS: 5.0000 mg | ORAL_TABLET | ORAL | Status: DC | PRN

## 2018-02-02 MED ORDER — METOCLOPRAMIDE HCL 5 MG PO TABS: 5.0000 mg | ORAL_TABLET | Freq: Three times a day (TID) | ORAL | Status: DC | PRN

## 2018-02-02 MED ORDER — MEPERIDINE HCL 25 MG/ML IJ SOLN: 6.2500 mg | INTRAMUSCULAR | Status: DC | PRN

## 2018-02-02 MED ORDER — OXYCODONE HCL 5 MG/5ML PO SOLN: 5.0000 mg | Freq: Once | ORAL | Status: AC | PRN

## 2018-02-02 MED ORDER — PROMETHAZINE HCL 25 MG/ML IJ SOLN: 6.2500 mg | INTRAMUSCULAR | Status: DC | PRN

## 2018-02-02 MED ORDER — FENTANYL CITRATE (PF) 100 MCG/2ML IJ SOLN
25.0000 ug | INTRAMUSCULAR | Status: DC | PRN
  Administered 2018-02-02 (×2): 50 ug via INTRAVENOUS

## 2018-02-02 MED ORDER — ONDANSETRON HCL 4 MG/2ML IJ SOLN: 4.0000 mg | Freq: Four times a day (QID) | INTRAMUSCULAR | Status: DC | PRN

## 2018-02-02 MED ORDER — PROPOFOL 10 MG/ML IV BOLUS
INTRAVENOUS | Status: DC | PRN
  Administered 2018-02-02 (×4): 100 mg via INTRAVENOUS

## 2018-02-02 MED ORDER — OXYCODONE HCL 5 MG PO TABS: 5.0000 mg | ORAL_TABLET | ORAL | 0 refills | Status: DC | PRN

## 2018-02-02 SURGICAL SUPPLY — 9 items
BNDG ADH 2 X3.75 FABRIC TAN LF (GAUZE/BANDAGES/DRESSINGS) ×3 IMPLANT
KIT RM TURNOVER STRD PROC AR (KITS) ×3 IMPLANT
NEEDLE HYPO 21X1.5 SAFETY (NEEDLE) IMPLANT
PAD ALCOHOL SWAB (MISCELLANEOUS) IMPLANT
SLING ARM LRG DEEP (SOFTGOODS) IMPLANT
SLING ARM M TX990204 (SOFTGOODS) IMPLANT
SLING ARM S TX990203 (SOFTGOODS) IMPLANT
STRAP BODY AND KNEE 60X3 (MISCELLANEOUS) ×3 IMPLANT
SYRINGE 10CC LL (SYRINGE) IMPLANT

## 2018-02-02 NOTE — Transfer of Care (Signed)
Immediate Anesthesia Transfer of Care Note  Patient: Krista Allen  Procedure(s) Performed: EXAM UNDER ANESTHESIA WITH MANIPULATION OF KNEE (Right )  Patient Location: PACU  Anesthesia Type: General  Level of Consciousness: awake, alert  and patient cooperative  Airway and Oxygen Therapy: Patient Spontanous Breathing and Patient connected to supplemental oxygen  Post-op Assessment: Post-op Vital signs reviewed, Patient's Cardiovascular Status Stable, Respiratory Function Stable, Patent Airway and No signs of Nausea or vomiting  Post-op Vital Signs: Reviewed and stable  Complications: No apparent anesthesia complications

## 2018-02-02 NOTE — H&P (Signed)
Paper H&P to be scanned into permanent record. H&P reviewed and patient re-examined. No changes. 

## 2018-02-02 NOTE — Anesthesia Preprocedure Evaluation (Signed)
Anesthesia Evaluation  Patient identified by MRN, date of birth, ID band Patient awake    Reviewed: Allergy & Precautions, H&P , NPO status , Patient's Chart, lab work & pertinent test results, reviewed documented beta blocker date and time   History of Anesthesia Complications Negative for: history of anesthetic complications  Airway Mallampati: II  TM Distance: >3 FB Neck ROM: full    Dental no notable dental hx.    Pulmonary former smoker,    Pulmonary exam normal breath sounds clear to auscultation       Cardiovascular Exercise Tolerance: Good negative cardio ROS   Rhythm:regular Rate:Normal     Neuro/Psych  Headaches, negative psych ROS   GI/Hepatic negative GI ROS, Neg liver ROS,   Endo/Other  negative endocrine ROS  Renal/GU negative Renal ROS  negative genitourinary   Musculoskeletal   Abdominal   Peds  Hematology negative hematology ROS (+)   Anesthesia Other Findings   Reproductive/Obstetrics negative OB ROS                             Anesthesia Physical Anesthesia Plan  ASA: II  Anesthesia Plan: General   Post-op Pain Management:    Induction:   PONV Risk Score and Plan:   Airway Management Planned:   Additional Equipment:   Intra-op Plan:   Post-operative Plan:   Informed Consent: I have reviewed the patients History and Physical, chart, labs and discussed the procedure including the risks, benefits and alternatives for the proposed anesthesia with the patient or authorized representative who has indicated his/her understanding and acceptance.   Dental Advisory Given  Plan Discussed with: CRNA  Anesthesia Plan Comments:         Anesthesia Quick Evaluation

## 2018-02-02 NOTE — Anesthesia Procedure Notes (Signed)
Procedure Name: MAC Date/Time: 02/02/2018 11:40 AM Performed by: Janna Arch, CRNA Pre-anesthesia Checklist: Emergency Drugs available, Suction available, Patient identified and Patient being monitored Patient Re-evaluated:Patient Re-evaluated prior to induction Oxygen Delivery Method: Simple face mask

## 2018-02-02 NOTE — Anesthesia Postprocedure Evaluation (Signed)
Anesthesia Post Note  Patient: Krista Allen  Procedure(s) Performed: EXAM UNDER ANESTHESIA WITH MANIPULATION OF KNEE (Right )  Patient location during evaluation: PACU Anesthesia Type: General Level of consciousness: awake and alert Pain management: pain level controlled Vital Signs Assessment: post-procedure vital signs reviewed and stable Respiratory status: spontaneous breathing, nonlabored ventilation, respiratory function stable and patient connected to nasal cannula oxygen Cardiovascular status: blood pressure returned to baseline and stable Postop Assessment: no apparent nausea or vomiting Anesthetic complications: no    SCOURAS, NICOLE ELAINE

## 2018-02-02 NOTE — Discharge Instructions (Signed)
General Anesthesia, Adult, Care After These instructions provide you with information about caring for yourself after your procedure. Your health care provider may also give you more specific instructions. Your treatment has been planned according to current medical practices, but problems sometimes occur. Call your health care provider if you have any problems or questions after your procedure. What can I expect after the procedure? After the procedure, it is common to have:  Vomiting.  A sore throat.  Mental slowness.  It is common to feel:  Nauseous.  Cold or shivery.  Sleepy.  Tired.  Sore or achy, even in parts of your body where you did not have surgery.  Follow these instructions at home: For at least 24 hours after the procedure:  Do not: ? Participate in activities where you could fall or become injured. ? Drive. ? Use heavy machinery. ? Drink alcohol. ? Take sleeping pills or medicines that cause drowsiness. ? Make important decisions or sign legal documents. ? Take care of children on your own.  Rest. Eating and drinking  If you vomit, drink water, juice, or soup when you can drink without vomiting.  Drink enough fluid to keep your urine clear or pale yellow.  Make sure you have little or no nausea before eating solid foods.  Follow the diet recommended by your health care provider. General instructions  Have a responsible adult stay with you until you are awake and alert.  Return to your normal activities as told by your health care provider. Ask your health care provider what activities are safe for you.  Take over-the-counter and prescription medicines only as told by your health care provider.  If you smoke, do not smoke without supervision.  Keep all follow-up visits as told by your health care provider. This is important. Contact a health care provider if:  You continue to have nausea or vomiting at home, and medicines are not helpful.  You  cannot drink fluids or start eating again.  You cannot urinate after 8-12 hours.  You develop a skin rash.  You have fever.  You have increasing redness at the site of your procedure. Get help right away if:  You have difficulty breathing.  You have chest pain.  You have unexpected bleeding.  You feel that you are having a life-threatening or urgent problem. This information is not intended to replace advice given to you by your health care provider. Make sure you discuss any questions you have with your health care provider. Document Released: 03/22/2001 Document Revised: 05/18/2016 Document Reviewed: 11/28/2015 Elsevier Interactive Patient Education  2018 [employer]   May shower tonight.  Apply ice frequently to knee. Take pain medication as prescribed or ES Tylenol when needed.  May weight-bear as tolerated - use crutches or walker as needed. Start physical therapy tomorrow as scheduled. Follow-up in 10-14 days or as scheduled.

## 2018-02-02 NOTE — Op Note (Signed)
02/02/2018  12:05 PM  Patient:   Krista Allen  Pre-Op Diagnosis:   Limited range of motion status post right total knee.  Post-Op Diagnosis:   Same.  Procedure:   Manipulation under anesthesia with steroid injection right knee.  Surgeon:   Maryagnes AmosJ. Jeffrey Catlyn Shipton, MD  Assistant:   None  Anesthesia:   IV sedation  Findings:   As above.  Complications:   None  Fluids:   650 cc crystalloid  EBL:   None  UOP:   None  TT:   None  Drains:   None  Closure:   None  Brief Clinical Note:   The patient is a 52 year old female who is now 11 weeks status post a right total knee arthroplasty.  Despite extensive physical therapy, the patient continues to have difficulty regaining her flexion.  She presents at this time for a manipulation under anesthesia with steroid injection.  Procedure:   The patient was brought into the operating room and lain supine position.  As achieved, the knee was gently manipulated.  It was able to be flexed to 115 degrees initially with ease.  With gentle steady pressure, several palpable and audible pops were heard as the scar tissue released, enabling the need to be flex to 145 degrees.  The knee also was able to be extended to 0 degrees.  Using sterile technique, the knee was injected with a solution of 1 cc of Kenalog 40 (40 mg) and 9 cc of 0.5% Sensorcaine.  The patient was then awakened and returned to the recovery room in satisfactory condition after tolerating the procedure well.

## 2018-03-04 ENCOUNTER — Other Ambulatory Visit: Payer: 59

## 2018-03-04 NOTE — H&P (Signed)
Chief Complaint:     Krista Allen is a 52 y.o. female here for Pre Op Consulting .  HPI:  Pt presents for a preoperative visit to schedule a D&C, hysteroscopy.  She has a hx of: Thickened endometrial stripe: cervical stenosis, fluid in canal, with cramping, on xeralto  EMBx unsuccessful  Past Medical History:  has a past medical history of Diverticulitis, IBS (irritable bowel syndrome), Migraine, Osteoarthritis, Seasonal allergies, and Vitamin B 12 deficiency.  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 Family History: family history includes High blood pressure (Hypertension) in her father; Lung cancer in her father; Myocardial Infarction (Heart attack) in her father; Prostate cancer in her brother and father; Stroke in her father. Social History:  reports that she quit smoking about 20 months ago. She has never used smokeless tobacco. She reports that she drinks alcohol. She reports that she does not use drugs. OB/GYN History:  OB History    Gravida  2   Para  2   Term  2   Preterm      AB      Living  2     SAB      TAB      Ectopic      Molar      Multiple      Live Births             Allergies: has No Known Allergies. Medications:  Current Outpatient Medications:  .   ALPRAZolam (XANAX) 0.5 MG tablet, Take 0.5 mg by mouth 2 (two) times daily as needed for Sleep, Disp: , Rfl:  .  gabapentin (NEURONTIN) 100 MG capsule, Take 1 capsule (100 mg total) by mouth 3 (three) times daily, Disp: 90 capsule, Rfl: 11 .  omeprazole (PRILOSEC) 40 MG DR capsule, Take 1 capsule (40 mg total) by mouth once daily (Patient not taking: Reported on 02/18/2018 ), Disp: 30 capsule, Rfl: 11 .  oxyCODONE (ROXICODONE) 5 MG immediate release tablet, Take by mouth, Disp: , Rfl:  .  promethazine (PHENERGAN) 12.5 MG tablet, Take 1 tablet (12.5 mg total) by mouth every 6 (six) hours as needed for Nausea (Patient not taking: Reported on 02/18/2018 ), Disp: 28 tablet, Rfl: 1 .  QUEtiapine (SEROQUEL) 25 MG tablet, Take 25 mg by mouth nightly  , Disp: , Rfl:  .  rivaroxaban (XARELTO) 20 mg tablet, Take 20 mg by mouth once daily, Disp: , Rfl:  .  sulfamethoxazole-trimethoprim (BACTRIM DS) 800-160 mg tablet, Take 1 tablet by mouth once daily  , Disp: , Rfl:  .  venlafaxine (EFFEXOR-XR) 150 MG XR capsule, Take 1 capsule (150 mg total) by mouth once daily., Disp: 30 capsule, Rfl: 11 .  venlafaxine (EFFEXOR-XR) 75 MG XR capsule, Take 1 capsule (75 mg total) by mouth once daily., Disp: 30 capsule, Rfl: 11  Current Facility-Administered Medications:  .  cyanocobalamin (VITAMIN B12) injection 1,000 mcg, 1,000 mcg, Intramuscular,  Q30 Days, Marguarite Arbour, MD, 1,000 mcg at 03/03/18 1538  Review of Systems: No SOB, no palpitations or chest pain, no new lower extremity edema, no nausea or vomiting or bowel or bladder complaints. See HPI for gyn specific ROS.   Exam:   Vitals:   03/03/18 1409  BP: 126/88  Pulse: 71    WDWN   female in NAD Body mass index is 26.52 kg/m.  General: Patient is well-groomed, well-nourished, appears stated age in no acute distress  HEENT: head is atraumatic and normocephalic, trachea is midline, neck is supple with no palpable nodules  CV: Regular rhythm and normal  heart rate, no murmur  Pulm: Clear to auscultation throughout lung fields with no wheezing, crackles, or rhonchi. No increased work of breathing  Abdomen: soft , no mass, non-tender, no rebound tenderness, no hepatomegaly  Pelvic: Deferred  Impression:   The encounter diagnosis was Post-menopausal bleeding.    Plan:   -  Preoperative visit: D&C hysteroscopy, possible polypectomy. Consents signed today. Risks of surgery were discussed with the patient including but not limited to: bleeding which may require transfusion; infection which may require antibiotics; injury to uterus or surrounding organs; intrauterine scarring which may impair future fertility; need for additional procedures including laparotomy or laparoscopy; and other postoperative/anesthesia complications. Written informed consent was obtained.  This is a scheduled same-day surgery. She will have a postop visit in 2 weeks to review operative findings and pathology.  -   Return if symptoms worsen or fail to improve.  Cecilie Kicks, MD

## 2018-03-07 ENCOUNTER — Encounter
Admission: RE | Admit: 2018-03-07 | Discharge: 2018-03-07 | Disposition: A | Discharge status: Home / Self Care | Payer: 59 | Source: Ambulatory Visit | Attending: Obstetrics and Gynecology | Admitting: Obstetrics and Gynecology

## 2018-03-07 ENCOUNTER — Other Ambulatory Visit: Payer: Self-pay

## 2018-03-07 HISTORY — DX: Acute embolism and thrombosis of unspecified deep veins of unspecified lower extremity: I82.409

## 2018-03-07 NOTE — Patient Instructions (Signed)
Your procedure is scheduled on: 03-11-18 FRIDAY Report to Same Day Surgery 2nd floor medical mall [facility] Entrance-take elevator on left to 2nd floor.  Check in with surgery information desk.) To find out your arrival time please call [phone] between 1PM - 3PM on 03-10-18 THURSDAY  Remember: Instructions that are not followed completely may result in serious medical risk, up to and including death, or upon the discretion of your surgeon and anesthesiologist your surgery may need to be rescheduled.    _x___ 1. Do not eat food after midnight the night before your procedure. NO GUM OR CANDY AFTER MIDNIGHT.  You may drink clear liquids up to 2 hours before you are scheduled to arrive at the hospital for your procedure.  Do not drink clear liquids within 2 hours of your scheduled arrival to the hospital.  Clear liquids include  --Water or Apple juice without pulp  --Clear carbohydrate beverage such as ClearFast or Gatorade  --Black Coffee or Clear Tea (No milk, no creamers, do not add anything to  the coffee or Tea     __x__ 2. No Alcohol for 24 hours before or after surgery.   __x__3. No Smoking or e-cigarettes for 24 prior to surgery.  Do not use any chewable tobacco products for at least 6 hour prior to surgery   ____  4. Bring all medications with you on the day of surgery if instructed.    __x__ 5. Notify your doctor if there is any change in your medical condition     (cold, fever, infections).    x___6. On the morning of surgery brush your teeth with toothpaste and water.  You may rinse your mouth with mouth wash if you wish.  Do not swallow any toothpaste or mouthwash.   Do not wear jewelry, make-up, hairpins, clips or nail polish.  Do not wear lotions, powders, or perfumes. You may wear deodorant.  Do not shave 48 hours prior to surgery. Men may shave face and neck.  Do not bring valuables to the hospital.    [facility] is not responsible for any belongings or  valuables.               Contacts, dentures or bridgework may not be worn into surgery.  Leave your suitcase in the car. After surgery it may be brought to your room.  For patients admitted to the hospital, discharge time is determined by your treatment team.  _  Patients discharged the day of surgery will not be allowed to drive home.  You will need someone to drive you home and stay with you the night of your procedure.    Please read over the following fact sheets that you were given:   [facility] Preparing for Surgery and or MRSA Information   _x___ TAKE THE FOLLOWING MEDICATION THE MORNING OF SURGERY WITH A SMALL SIP OF WATER. These include:  1. EFFEXOR (VENLAFAXINE)  2. YOU MAY TAKE XANAX IF NEEDED AM OF SURGERY WITH A SMALL SIP OF WATER  3.  4.  5.  6.  ____Fleets enema or Magnesium Citrate as directed.   ____ Use CHG Soap or sage wipes as directed on instruction sheet   ____ Use inhalers on the day of surgery and bring to hospital day of surgery  ____ Stop Metformin and Janumet 2 days prior to surgery.    ____ Take 1/2 of usual insulin dose the night before surgery and none on the morning surgery.   _x___  Follow recommendations from Cardiologist, Pulmonologist or PCP regarding stopping Aspirin, Coumadin, Plavix ,Eliquis, Effient, or Pradaxa, and Pletal-ASK DR BEASLEY/DR SPARKS ABOUT XARELTO  X____Stop Anti-inflammatories such as Advil, Aleve, Ibuprofen, Motrin, Naproxen, Naprosyn, Goodies powders or aspirin products NOW-OK to take Tylenol    ____ Stop supplements until after surgery.    ____ Bring C-Pap to the hospital.

## 2018-03-09 ENCOUNTER — Encounter
Admission: RE | Admit: 2018-03-09 | Discharge: 2018-03-09 | Disposition: A | Discharge status: Home / Self Care | Payer: 59 | Source: Ambulatory Visit | Attending: Obstetrics and Gynecology | Admitting: Obstetrics and Gynecology

## 2018-03-09 DIAGNOSIS — N882 Stricture and stenosis of cervix uteri: Secondary | ICD-10-CM | POA: Diagnosis not present

## 2018-03-09 DIAGNOSIS — Z86718 Personal history of other venous thrombosis and embolism: Secondary | ICD-10-CM | POA: Diagnosis not present

## 2018-03-09 DIAGNOSIS — R9389 Abnormal findings on diagnostic imaging of other specified body structures: Secondary | ICD-10-CM | POA: Diagnosis present

## 2018-03-09 DIAGNOSIS — N950 Postmenopausal bleeding: Secondary | ICD-10-CM | POA: Diagnosis not present

## 2018-03-09 DIAGNOSIS — Z79891 Long term (current) use of opiate analgesic: Secondary | ICD-10-CM | POA: Diagnosis not present

## 2018-03-09 DIAGNOSIS — Z79899 Other long term (current) drug therapy: Secondary | ICD-10-CM | POA: Diagnosis not present

## 2018-03-09 DIAGNOSIS — Z87891 Personal history of nicotine dependence: Secondary | ICD-10-CM | POA: Diagnosis not present

## 2018-03-09 DIAGNOSIS — Z7901 Long term (current) use of anticoagulants: Secondary | ICD-10-CM | POA: Diagnosis not present

## 2018-03-09 LAB — CBC
HCT: 42.2 % (ref 35.0–47.0)
Hemoglobin: 14 g/dL (ref 12.0–16.0)
MCH: 29.4 pg (ref 26.0–34.0)
MCHC: 33.1 g/dL (ref 32.0–36.0)
MCV: 88.8 fL (ref 80.0–100.0)
PLATELETS: 296 10*3/uL (ref 150–440)
RBC: 4.76 MIL/uL (ref 3.80–5.20)
RDW: 15.7 % — ABNORMAL HIGH (ref 11.5–14.5)
WBC: 5.3 10*3/uL (ref 3.6–11.0)

## 2018-03-09 LAB — BASIC METABOLIC PANEL
ANION GAP: 7 (ref 5–15)
BUN: 18 mg/dL (ref 6–20)
CHLORIDE: 105 mmol/L (ref 101–111)
CO2: 27 mmol/L (ref 22–32)
CREATININE: 0.9 mg/dL (ref 0.44–1.00)
Calcium: 9.2 mg/dL (ref 8.9–10.3)
GFR calc non Af Amer: >60 mL/min (ref >60)
Glucose, Bld: 112 mg/dL — ABNORMAL HIGH (ref 65–99)
Potassium: 4.2 mmol/L (ref 3.5–5.1)
Sodium: 139 mmol/L (ref 135–145)

## 2018-03-09 LAB — TYPE AND SCREEN
ABO/RH(D): B POS
Antibody Screen: NEGATIVE

## 2018-03-11 ENCOUNTER — Encounter: Payer: Self-pay | Admitting: *Deleted

## 2018-03-11 ENCOUNTER — Other Ambulatory Visit: Payer: Self-pay

## 2018-03-11 ENCOUNTER — Ambulatory Visit
Admission: RE | Admit: 2018-03-11 | Discharge: 2018-03-11 | Disposition: A | Discharge status: Home / Self Care | Payer: 59 | Source: Ambulatory Visit | Attending: Obstetrics and Gynecology | Admitting: Obstetrics and Gynecology

## 2018-03-11 ENCOUNTER — Encounter
Admission: RE | Disposition: A | Discharge status: Home / Self Care | Payer: Self-pay | Source: Ambulatory Visit | Attending: Obstetrics and Gynecology

## 2018-03-11 ENCOUNTER — Ambulatory Visit: Payer: 59 | Admitting: Anesthesiology

## 2018-03-11 DIAGNOSIS — R9389 Abnormal findings on diagnostic imaging of other specified body structures: Secondary | ICD-10-CM | POA: Insufficient documentation

## 2018-03-11 DIAGNOSIS — Z7901 Long term (current) use of anticoagulants: Secondary | ICD-10-CM | POA: Insufficient documentation

## 2018-03-11 DIAGNOSIS — Z87891 Personal history of nicotine dependence: Secondary | ICD-10-CM | POA: Insufficient documentation

## 2018-03-11 DIAGNOSIS — N950 Postmenopausal bleeding: Secondary | ICD-10-CM | POA: Insufficient documentation

## 2018-03-11 DIAGNOSIS — Z86718 Personal history of other venous thrombosis and embolism: Secondary | ICD-10-CM | POA: Insufficient documentation

## 2018-03-11 DIAGNOSIS — N882 Stricture and stenosis of cervix uteri: Secondary | ICD-10-CM | POA: Insufficient documentation

## 2018-03-11 DIAGNOSIS — Z79899 Other long term (current) drug therapy: Secondary | ICD-10-CM | POA: Insufficient documentation

## 2018-03-11 DIAGNOSIS — Z79891 Long term (current) use of opiate analgesic: Secondary | ICD-10-CM | POA: Insufficient documentation

## 2018-03-11 HISTORY — PX: HYSTEROSCOPY WITH D & C: SHX1775

## 2018-03-11 LAB — POCT PREGNANCY, URINE: Preg Test, Ur: NEGATIVE

## 2018-03-11 SURGERY — DILATATION AND CURETTAGE /HYSTEROSCOPY
Anesthesia: General

## 2018-03-11 MED ORDER — LACTATED RINGERS IV SOLN: INTRAVENOUS | Status: DC

## 2018-03-11 MED ORDER — OXYCODONE HCL 5 MG PO TABS
5.0000 mg | ORAL_TABLET | Freq: Once | ORAL | Status: AC | PRN
  Administered 2018-03-11: 5 mg via ORAL

## 2018-03-11 MED ORDER — FAMOTIDINE 20 MG PO TABS
ORAL_TABLET | ORAL | Status: AC
  Filled 2018-03-11: qty 1

## 2018-03-11 MED ORDER — FENTANYL CITRATE (PF) 100 MCG/2ML IJ SOLN
INTRAMUSCULAR | Status: AC
  Administered 2018-03-11: 25 ug via INTRAVENOUS
  Filled 2018-03-11: qty 2

## 2018-03-11 MED ORDER — ACETAMINOPHEN 10 MG/ML IV SOLN
INTRAVENOUS | Status: AC
  Filled 2018-03-11: qty 100

## 2018-03-11 MED ORDER — GLYCOPYRROLATE 0.2 MG/ML IJ SOLN
INTRAMUSCULAR | Status: AC
  Filled 2018-03-11: qty 1

## 2018-03-11 MED ORDER — MIDAZOLAM HCL 2 MG/2ML IJ SOLN
INTRAMUSCULAR | Status: AC
  Filled 2018-03-11: qty 4

## 2018-03-11 MED ORDER — GLYCOPYRROLATE 0.2 MG/ML IJ SOLN
INTRAMUSCULAR | Status: DC | PRN
  Administered 2018-03-11: 0.1 mg via INTRAVENOUS

## 2018-03-11 MED ORDER — LIDOCAINE HCL (CARDIAC) 20 MG/ML IV SOLN
INTRAVENOUS | Status: DC | PRN
  Administered 2018-03-11: 60 mg via INTRAVENOUS

## 2018-03-11 MED ORDER — OXYCODONE HCL 5 MG/5ML PO SOLN: 5.0000 mg | Freq: Once | ORAL | Status: AC | PRN

## 2018-03-11 MED ORDER — HYDROMORPHONE HCL 1 MG/ML IJ SOLN
INTRAMUSCULAR | Status: AC
  Filled 2018-03-11: qty 1

## 2018-03-11 MED ORDER — PROPOFOL 10 MG/ML IV BOLUS
INTRAVENOUS | Status: AC
  Filled 2018-03-11: qty 20

## 2018-03-11 MED ORDER — EPHEDRINE SULFATE 50 MG/ML IJ SOLN
INTRAMUSCULAR | Status: DC | PRN
  Administered 2018-03-11: 10 mg via INTRAVENOUS

## 2018-03-11 MED ORDER — FENTANYL CITRATE (PF) 100 MCG/2ML IJ SOLN
25.0000 ug | INTRAMUSCULAR | Status: DC | PRN
  Administered 2018-03-11 (×5): 25 ug via INTRAVENOUS

## 2018-03-11 MED ORDER — LACTATED RINGERS IV SOLN
INTRAVENOUS | Status: DC
  Administered 2018-03-11 (×2): via INTRAVENOUS

## 2018-03-11 MED ORDER — DEXAMETHASONE SODIUM PHOSPHATE 10 MG/ML IJ SOLN
INTRAMUSCULAR | Status: DC | PRN
  Administered 2018-03-11: 10 mg via INTRAVENOUS

## 2018-03-11 MED ORDER — PROPOFOL 10 MG/ML IV BOLUS
INTRAVENOUS | Status: DC | PRN
  Administered 2018-03-11: 150 mg via INTRAVENOUS
  Administered 2018-03-11: 50 mg via INTRAVENOUS

## 2018-03-11 MED ORDER — LIDOCAINE HCL (PF) 2 % IJ SOLN
INTRAMUSCULAR | Status: AC
  Filled 2018-03-11: qty 10

## 2018-03-11 MED ORDER — SILVER NITRATE-POT NITRATE 75-25 % EX MISC
CUTANEOUS | Status: DC | PRN
  Administered 2018-03-11: 1 via TOPICAL

## 2018-03-11 MED ORDER — MEPERIDINE HCL 50 MG/ML IJ SOLN: 6.2500 mg | INTRAMUSCULAR | Status: DC | PRN

## 2018-03-11 MED ORDER — ACETAMINOPHEN 10 MG/ML IV SOLN
INTRAVENOUS | Status: DC | PRN
  Administered 2018-03-11: 1000 mg via INTRAVENOUS

## 2018-03-11 MED ORDER — DOCUSATE SODIUM 100 MG PO CAPS: 100.0000 mg | ORAL_CAPSULE | Freq: Two times a day (BID) | ORAL | 0 refills | Status: DC

## 2018-03-11 MED ORDER — ONDANSETRON HCL 4 MG/2ML IJ SOLN
INTRAMUSCULAR | Status: AC
  Filled 2018-03-11: qty 2

## 2018-03-11 MED ORDER — DEXAMETHASONE SODIUM PHOSPHATE 10 MG/ML IJ SOLN
INTRAMUSCULAR | Status: AC
  Filled 2018-03-11: qty 1

## 2018-03-11 MED ORDER — PHENYLEPHRINE HCL 10 MG/ML IJ SOLN
INTRAMUSCULAR | Status: AC
  Filled 2018-03-11: qty 1

## 2018-03-11 MED ORDER — PROMETHAZINE HCL 25 MG/ML IJ SOLN: 6.2500 mg | INTRAMUSCULAR | Status: DC | PRN

## 2018-03-11 MED ORDER — OXYCODONE HCL 5 MG PO TABS
ORAL_TABLET | ORAL | Status: AC
  Administered 2018-03-11: 5 mg via ORAL
  Filled 2018-03-11: qty 1

## 2018-03-11 MED ORDER — PHENYLEPHRINE HCL 10 MG/ML IJ SOLN
INTRAMUSCULAR | Status: DC | PRN
  Administered 2018-03-11 (×2): 100 ug via INTRAVENOUS
  Administered 2018-03-11: 200 ug via INTRAVENOUS
  Administered 2018-03-11: 100 ug via INTRAVENOUS

## 2018-03-11 MED ORDER — HYDROMORPHONE HCL 1 MG/ML IJ SOLN
INTRAMUSCULAR | Status: DC | PRN
  Administered 2018-03-11: 1 mg via INTRAVENOUS

## 2018-03-11 MED ORDER — FAMOTIDINE 20 MG PO TABS
20.0000 mg | ORAL_TABLET | Freq: Once | ORAL | Status: AC
  Administered 2018-03-11: 20 mg via ORAL

## 2018-03-11 MED ORDER — ONDANSETRON HCL 4 MG/2ML IJ SOLN
INTRAMUSCULAR | Status: DC | PRN
  Administered 2018-03-11: 4 mg via INTRAVENOUS

## 2018-03-11 MED ORDER — MIDAZOLAM HCL 2 MG/2ML IJ SOLN
INTRAMUSCULAR | Status: DC | PRN
  Administered 2018-03-11: 4 mg via INTRAVENOUS

## 2018-03-11 SURGICAL SUPPLY — 17 items
BAG INFUSER PRESSURE 100CC (MISCELLANEOUS) ×3 IMPLANT
CANISTER SUCT 3000ML PPV (MISCELLANEOUS) ×3 IMPLANT
CATH ROBINSON RED A/P 16FR (CATHETERS) ×3 IMPLANT
ELECT REM PT RETURN 9FT ADLT (ELECTROSURGICAL) ×3
ELECTRODE REM PT RTRN 9FT ADLT (ELECTROSURGICAL) ×1 IMPLANT
GLOVE BIO SURGEON STRL SZ7 (GLOVE) ×3 IMPLANT
GLOVE INDICATOR 7.5 STRL GRN (GLOVE) ×3 IMPLANT
GOWN STRL REUS W/ TWL LRG LVL3 (GOWN DISPOSABLE) ×2 IMPLANT
GOWN STRL REUS W/TWL LRG LVL3 (GOWN DISPOSABLE) ×4
IV LACTATED RINGERS 1000ML (IV SOLUTION) ×3 IMPLANT
KIT TURNOVER CYSTO (KITS) ×3 IMPLANT
PACK DNC HYST (MISCELLANEOUS) ×3 IMPLANT
PAD OB MATERNITY 4.3X12.25 (PERSONAL CARE ITEMS) ×3 IMPLANT
PAD PREP 24X41 OB/GYN DISP (PERSONAL CARE ITEMS) ×3 IMPLANT
TUBING CONNECTING 10 (TUBING) ×2 IMPLANT
TUBING CONNECTING 10' (TUBING) ×1
TUBING HYSTEROSCOPY DOLPHIN (MISCELLANEOUS) ×3 IMPLANT

## 2018-03-11 NOTE — Transfer of Care (Signed)
Immediate Anesthesia Transfer of Care Note  Patient: Carolynn CommentElizabeth J Schuchard  Procedure(s) Performed: DILATATION AND CURETTAGE /HYSTEROSCOPY (N/A )  Patient Location: PACU  Anesthesia Type:General  Level of Consciousness: awake, alert , oriented and patient cooperative  Airway & Oxygen Therapy: Patient Spontanous Breathing and Patient connected to face mask oxygen  Post-op Assessment: Report given to RN, Post -op Vital signs reviewed and stable and Patient moving all extremities X 4  Post vital signs: Reviewed and stable  Last Vitals:  Vitals:   03/11/18 0937 03/11/18 1206  BP: (!) 135/93 117/77  Pulse: 73 88  Resp: 18 12  Temp: 36.6 C 36.5 C  SpO2: 100% 100%    Last Pain:  Vitals:   03/11/18 0937  TempSrc: Oral         Complications: No apparent anesthesia complications

## 2018-03-11 NOTE — Anesthesia Preprocedure Evaluation (Signed)
Anesthesia Evaluation  Patient identified by MRN, date of birth, ID band Patient awake    Reviewed: Allergy & Precautions, NPO status , Patient's Chart, lab work & pertinent test results  History of Anesthesia Complications Negative for: history of anesthetic complications  Airway Mallampati: II  TM Distance: >3 FB Neck ROM: Full    Dental no notable dental hx.    Pulmonary neg sleep apnea, neg COPD, former smoker,    breath sounds clear to auscultation- rhonchi (-) wheezing      Cardiovascular Exercise Tolerance: Good (-) hypertension(-) CAD, (-) Past MI, (-) Cardiac Stents and (-) CABG  Rhythm:Regular Rate:Normal - Systolic murmurs and - Diastolic murmurs    Neuro/Psych  Headaches, negative psych ROS   GI/Hepatic negative GI ROS, Neg liver ROS,   Endo/Other  negative endocrine ROSneg diabetes  Renal/GU negative Renal ROS     Musculoskeletal  (+) Arthritis ,   Abdominal (+) - obese,   Peds  Hematology negative hematology ROS (+)   Anesthesia Other Findings Past Medical History: No date: Arthritis No date: Burn     Comment:  LOWER LEG, SECOND DEGREE No date: Diverticulitis No date: DVT (deep venous thrombosis) (HCC)     Comment:  AFTER 10-2017 TKR No date: Family history of adverse reaction to anesthesia     Comment:  other - PONV No date: Headache     Comment:  MIGRAINES   Reproductive/Obstetrics                             Anesthesia Physical Anesthesia Plan  ASA: II  Anesthesia Plan: General   Post-op Pain Management:    Induction: Intravenous  PONV Risk Score and Plan: 2 and Ondansetron, Dexamethasone and Midazolam  Airway Management Planned: LMA  Additional Equipment:   Intra-op Plan:   Post-operative Plan:   Informed Consent: I have reviewed the patients History and Physical, chart, labs and discussed the procedure including the risks, benefits and alternatives  for the proposed anesthesia with the patient or authorized representative who has indicated his/her understanding and acceptance.   Dental advisory given  Plan Discussed with: CRNA and Anesthesiologist  Anesthesia Plan Comments:         Anesthesia Quick Evaluation

## 2018-03-11 NOTE — Anesthesia Postprocedure Evaluation (Signed)
Anesthesia Post Note  Patient: Krista Allen  Procedure(s) Performed: DILATATION AND CURETTAGE /HYSTEROSCOPY (N/A )  Patient location during evaluation: PACU Anesthesia Type: General Level of consciousness: awake and alert Pain management: pain level controlled Vital Signs Assessment: post-procedure vital signs reviewed and stable Respiratory status: spontaneous breathing, nonlabored ventilation, respiratory function stable and patient connected to nasal cannula oxygen Cardiovascular status: blood pressure returned to baseline and stable Postop Assessment: no apparent nausea or vomiting Anesthetic complications: no     Last Vitals:  Vitals:   03/11/18 1248 03/11/18 1251  BP:  (!) 129/93  Pulse: 92 81  Resp: 16 15  Temp:    SpO2: 97% 96%    Last Pain:  Vitals:   03/11/18 1248  TempSrc:   PainSc: 4                  Jomarie LongsJoseph K Glynn Yepes

## 2018-03-11 NOTE — Anesthesia Post-op Follow-up Note (Signed)
Anesthesia QCDR form completed.        

## 2018-03-11 NOTE — Op Note (Signed)
Operative Report Hysteroscopy with Dilation and Curettage   Indications: Postmenopausal bleeding on blood thinners   Pre-operative Diagnosis: PMB, cervical stenosis   Post-operative Diagnosis: same.  Procedure: 1. Exam under anesthesia 2. Endocervical curettings 3. Failed dilation and curettage   Surgeon: Christeen DouglasBethany Lauris Keepers, MD  Assistant(s):  None  Anesthesia: General LMA anesthesia  Anesthesiologist: No responsible provider has been recorded for the case. Anesthesiologist: Alver FisherPenwarden, Amy, MD; Piscitello, Cleda MccreedyJoseph K, MD CRNA: Stormy Fabianurtis, Linda, CRNA; Sherol DadeMacMang, Josephine H, CRNA  Estimated Blood Loss:  Minimal         Intraoperative medications: n/a         Total IV Fluids: 1100ml  Urine Output: 100ml  Total Fluid Deficit:  n/a          Specimens: Endocervical curettings,         Complications:  None; patient tolerated the procedure well.         Disposition: PACU - hemodynamically stable.         Condition: stable  Findings: Small uterus, normal ecto- cervix, vagina, perineum. Cervical stenosis precluded dilation and we were not able to enter into the endometrial canal.  Indication for procedure/Consents: 52 y.o. here for scheduled surgery for the aforementioned diagnoses.Risks of surgery were discussed with the patient including but not limited to: bleeding which may require transfusion; infection which may require antibiotics; injury to uterus or surrounding organs; intrauterine scarring which may impair future fertility; need for additional procedures including laparotomy or laparoscopy; and other postoperative/anesthesia complications. Written informed consent was obtained.    Procedure Details:  Fractional D&C only  The patient was taken to the operating room where anesthesia was administered and was found to be adequate. After a formal and adequate timeout was performed, she was placed in the dorsal lithotomy position and examined with the above findings. She was then  prepped and draped in the sterile manner. Her bladder was catheterized for an estimated amount of clear, yellow urine. A weighed speculum was then placed in the patient's vagina and a single tooth tenaculum was applied to the anterior lip of the cervix.  Her cervix was attempted to be serially dilated to 15 JamaicaFrench using Hanks dilators. An ECC was performed. However, the cervix was stenotic. Using Hagar dilators I was able to place a 20F dilator to the fundus, but not a 30F. Without excessive force, no further dilation was able to be performed. A canal was visible and pictures were taken, but the procedure was aborted due to safety.   The tenaculum was removed from the anterior lip of the cervix and the vaginal speculum was removed after applying silver nitrate for good hemostasis.   The patient tolerated the procedure well and was taken to the recovery area awake and in stable condition. She received iv acetaminophen and Toradol prior to leaving the OR.  The patient will be discharged to home as per PACU criteria. Routine postoperative instructions given. She was prescribed Ibuprofen and Colace. She will follow up in the clinic in two weeks for postoperative evaluation.

## 2018-03-11 NOTE — Anesthesia Procedure Notes (Signed)
Procedure Name: LMA Insertion Date/Time: 03/11/2018 11:06 AM Performed by: Sherol DadeMacMang, Evaluna Utke H, CRNA Pre-anesthesia Checklist: Patient identified, Emergency Drugs available, Suction available, Patient being monitored and Timeout performed Patient Re-evaluated:Patient Re-evaluated prior to induction Oxygen Delivery Method: Circle system utilized Preoxygenation: Pre-oxygenation with 100% oxygen Induction Type: IV induction Ventilation: Mask ventilation without difficulty LMA: LMA inserted LMA Size: 3.5 Tube type: Oral Number of attempts: 1 Placement Confirmation: positive ETCO2,  CO2 detector and breath sounds checked- equal and bilateral Tube secured with: Tape Dental Injury: Teeth and Oropharynx as per pre-operative assessment

## 2018-03-11 NOTE — Interval H&P Note (Signed)
History and Physical Interval Note:  03/11/2018 10:44 AM  Krista Allen  has presented today for surgery, with the diagnosis of post menopausal bleeding, thick stripe, cervical stenosis  The various methods of treatment have been discussed with the patient and family. After consideration of risks, benefits and other options for treatment, the patient has consented to  Procedure(s): DILATATION AND CURETTAGE /HYSTEROSCOPY (N/A) as a surgical intervention .  The patient's history has been reviewed, patient examined, no change in status, stable for surgery.  I have reviewed the patient's chart and labs.  Questions were answered to the patient's satisfaction.    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 She also has cervical stenosis. We have discussed possible need for laparoscopy if uterine perforation without bleeding continues.   Christeen DouglasBethany Elanore Talcott

## 2018-03-14 LAB — SURGICAL PATHOLOGY

## 2018-03-23 NOTE — H&P (Signed)
Patient ID: Krista Allen is a 52 y.o. female presenting with Pre Op Consulting  on 03/23/2018  HPI: Postmenopausal bleeding,   2/19: PMB, on xeralto for DVT after knee replacement in 9/18.   Cervical stenosis: thick stripe with fluid. EMBx failed, D&C failed for cervical stenosis.  S/p LEEP procedure, +HPV   Past Medical History:  has a past medical history of Diverticulitis, IBS (irritable bowel syndrome), Migraine, Osteoarthritis, Seasonal allergies, and Vitamin B 12 deficiency.  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 Family History: family history includes High blood pressure (Hypertension) in her father; Lung cancer in her father; Myocardial Infarction (Heart attack) in her father; Prostate cancer in her brother and father; Stroke in her father. Social History:  reports that she quit smoking about 20 months ago. She has never used smokeless tobacco. She reports that she drinks alcohol. She reports that she does not use drugs. OB/GYN History:          OB History    Gravida  2   Para  2   Term  2   Preterm      AB      Living  2     SAB      TAB      Ectopic      Molar      Multiple      Live Births             Allergies: is allergic to  tramadol. Medications:  Current Outpatient Medications:  .  gabapentin (NEURONTIN) 100 MG capsule, Take 1 capsule (100 mg total) by mouth 3 (three) times daily, Disp: 90 capsule, Rfl: 11 .  QUEtiapine (SEROQUEL) 25 MG tablet, Take 25 mg by mouth nightly  , Disp: , Rfl:  .  rivaroxaban (XARELTO) 20 mg tablet, Take 20 mg by mouth once daily, Disp: , Rfl:  .  venlafaxine (EFFEXOR-XR) 150 MG XR capsule, Take 1 capsule (150 mg total) by mouth once daily., Disp: 30 capsule, Rfl: 11 .  venlafaxine (EFFEXOR-XR) 75 MG XR capsule, Take 1 capsule (75 mg total) by mouth once daily., Disp: 30 capsule, Rfl: 11 .  ALPRAZolam (XANAX) 0.5 MG tablet, Take 0.5 mg by mouth 2 (two) times daily as needed for Sleep, Disp: , Rfl:  .  omeprazole (PRILOSEC) 40 MG DR capsule, Take 1 capsule (40 mg total) by mouth once daily (Patient not taking: Reported on 02/18/2018 ), Disp: 30 capsule, Rfl: 11 .  promethazine (PHENERGAN) 12.5 MG tablet, Take 1 tablet (12.5 mg total) by mouth every 6 (six) hours as needed for Nausea (Patient not taking: Reported on 02/18/2018 ), Disp: 28 tablet, Rfl: 1 .  sulfamethoxazole-trimethoprim (BACTRIM DS) 800-160 mg tablet, Take 1 tablet by mouth once daily  , Disp: , Rfl:   Current Facility-Administered Medications:  .  cyanocobalamin (VITAMIN B12) injection 1,000 mcg, 1,000 mcg, Intramuscular, Q30 Days, Aram Beecham  D, MD, 1,000 mcg at 03/03/18 1538   Review of Systems: No SOB, no palpitations or chest pain, no new lower extremity edema, no nausea or vomiting or bowel or bladder complaints. See HPI for gyn specific ROS.   Exam:   BP 120/80   Ht 157.5 cm (5\' 2" )   Wt 66.2 kg (146 lb)   LMP 09/09/2016 (Approximate)   BMI 26.70 kg/m   General: Patient is well-groomed, well-nourished, appears stated age in no acute distress  HEENT: head is atraumatic and normocephalic, trachea is midline, neck is supple with no palpable nodules  CV: Regular rhythm and normal heart rate, no  murmur  Pulm: Clear to auscultation throughout lung fields with no wheezing, crackles, or rhonchi. No increased work of breathing  Abdomen: soft , no mass, non-tender, no rebound tenderness, no hepatomegaly  Pelvic: tanner stage 5 ,              External genitalia: vulva /labia no lesions             Urethra: no prolapse             Vagina: normal physiologic d/c, laxity in vaginal walls             Cervix: no lesions, no cervical motion tenderness, good descent             Uterus: normal size shape and contour, non-tender             Adnexa: no mass,  non-tender               Rectovaginal: External wnl   Impression:   The encounter diagnosis was Preop examination.    Plan:   Patient returns for a preoperative discussion regarding her plans to proceed with surgical treatment of her pain and bleeding by total vaginal hysterectomy with bilateral salpingectomy procedure.   She will get bridging for her Gibson RampXeralto with heparin prior, per PCP recs.  The patient and I discussed the technical aspects of the procedure including the potential for risks and complications. These include but are not limited to the risk of infection requiring post-operative antibiotics or further procedures. We talked about the risk of injury to adjacent organs including bladder, bowel, ureter, blood vessels or nerves. We talked about the need to convert to an open incision. We talked about the possible need for blood transfusion. We talked aboutpostop complications such asthromboembolic or cardiopulmonary complications. All of her questions were answered.  Her preoperative exam was completed and the appropriate consents were signed. She is scheduled to undergo this procedure in the near future.  Specific Peri-operative Considerations:  - Consent: obtained today - Health Maintenance: up to date - Labs: CBC, CMP preoperatively - Studies: EKG, CXR preoperatively - Bowel Preparation: None  required - Abx:  Cefoxitin 2g - VTE ppx: SCDs perioperatively - Glucose Protocol: n/a - Beta-blockade: n/a

## 2018-04-03 MED ORDER — CEFAZOLIN SODIUM-DEXTROSE 2-4 GM/100ML-% IV SOLN
2.0000 g | INTRAVENOUS | Status: DC
  Filled 2018-04-03: qty 100

## 2018-04-04 ENCOUNTER — Observation Stay
Admission: RE | Admit: 2018-04-04 | Discharge: 2018-04-05 | Disposition: A | Discharge status: Home / Self Care | Payer: 59 | Source: Ambulatory Visit | Attending: Obstetrics and Gynecology | Admitting: Obstetrics and Gynecology

## 2018-04-04 ENCOUNTER — Encounter
Admission: RE | Disposition: A | Discharge status: Home / Self Care | Payer: Self-pay | Source: Ambulatory Visit | Attending: Obstetrics and Gynecology

## 2018-04-04 ENCOUNTER — Ambulatory Visit: Payer: 59 | Admitting: Certified Registered"

## 2018-04-04 ENCOUNTER — Other Ambulatory Visit: Payer: Self-pay

## 2018-04-04 DIAGNOSIS — Z86718 Personal history of other venous thrombosis and embolism: Secondary | ICD-10-CM | POA: Diagnosis not present

## 2018-04-04 DIAGNOSIS — N950 Postmenopausal bleeding: Principal | ICD-10-CM | POA: Diagnosis present

## 2018-04-04 DIAGNOSIS — Z96659 Presence of unspecified artificial knee joint: Secondary | ICD-10-CM | POA: Diagnosis not present

## 2018-04-04 DIAGNOSIS — Z87891 Personal history of nicotine dependence: Secondary | ICD-10-CM | POA: Insufficient documentation

## 2018-04-04 DIAGNOSIS — Z7901 Long term (current) use of anticoagulants: Secondary | ICD-10-CM | POA: Diagnosis not present

## 2018-04-04 DIAGNOSIS — N870 Mild cervical dysplasia: Secondary | ICD-10-CM | POA: Insufficient documentation

## 2018-04-04 DIAGNOSIS — N882 Stricture and stenosis of cervix uteri: Secondary | ICD-10-CM | POA: Diagnosis not present

## 2018-04-04 DIAGNOSIS — Z79899 Other long term (current) drug therapy: Secondary | ICD-10-CM | POA: Insufficient documentation

## 2018-04-04 DIAGNOSIS — N8110 Cystocele, unspecified: Secondary | ICD-10-CM | POA: Diagnosis not present

## 2018-04-04 HISTORY — PX: VAGINAL HYSTERECTOMY: SHX2639

## 2018-04-04 HISTORY — PX: BILATERAL SALPINGECTOMY: SHX5743

## 2018-04-04 LAB — TYPE AND SCREEN
ABO/RH(D): B POS
ANTIBODY SCREEN: NEGATIVE

## 2018-04-04 LAB — POCT PREGNANCY, URINE: Preg Test, Ur: NEGATIVE

## 2018-04-04 SURGERY — HYSTERECTOMY, VAGINAL
Anesthesia: General | Site: Vagina | Wound class: Clean Contaminated

## 2018-04-04 MED ORDER — ACETAMINOPHEN 500 MG PO TABS
1000.0000 mg | ORAL_TABLET | ORAL | Status: AC
  Administered 2018-04-04: 1000 mg via ORAL

## 2018-04-04 MED ORDER — ACETAMINOPHEN 10 MG/ML IV SOLN
1000.0000 mg | Freq: Three times a day (TID) | INTRAVENOUS | Status: DC
  Filled 2018-04-04 (×3): qty 100

## 2018-04-04 MED ORDER — MEPERIDINE HCL 50 MG/ML IJ SOLN: 6.2500 mg | INTRAMUSCULAR | Status: DC | PRN

## 2018-04-04 MED ORDER — SILVER NITRATE-POT NITRATE 75-25 % EX MISC
CUTANEOUS | Status: AC
  Filled 2018-04-04: qty 1

## 2018-04-04 MED ORDER — PHENYLEPHRINE HCL 10 MG/ML IJ SOLN
INTRAMUSCULAR | Status: DC | PRN
  Administered 2018-04-04 (×5): 100 ug via INTRAVENOUS

## 2018-04-04 MED ORDER — ESTROGENS, CONJUGATED 0.625 MG/GM VA CREA
TOPICAL_CREAM | VAGINAL | Status: AC
  Filled 2018-04-04: qty 30

## 2018-04-04 MED ORDER — DEXAMETHASONE SODIUM PHOSPHATE 10 MG/ML IJ SOLN
INTRAMUSCULAR | Status: AC
Start: 2018-04-04 — End: ?
  Filled 2018-04-04: qty 1

## 2018-04-04 MED ORDER — LIDOCAINE-EPINEPHRINE 1 %-1:100000 IJ SOLN
INTRAMUSCULAR | Status: DC | PRN
  Administered 2018-04-04: 30 mL

## 2018-04-04 MED ORDER — CEFAZOLIN SODIUM-DEXTROSE 2-3 GM-%(50ML) IV SOLR
INTRAVENOUS | Status: DC | PRN
  Administered 2018-04-04: 2 g via INTRAVENOUS

## 2018-04-04 MED ORDER — LIDOCAINE HCL (CARDIAC) 20 MG/ML IV SOLN
INTRAVENOUS | Status: DC | PRN
  Administered 2018-04-04: 100 mg via INTRAVENOUS

## 2018-04-04 MED ORDER — SIMETHICONE 80 MG PO CHEW
80.0000 mg | CHEWABLE_TABLET | Freq: Four times a day (QID) | ORAL | Status: DC | PRN
  Administered 2018-04-05: 80 mg via ORAL
  Filled 2018-04-04: qty 1

## 2018-04-04 MED ORDER — HYDROMORPHONE HCL 1 MG/ML IJ SOLN
INTRAMUSCULAR | Status: AC
  Administered 2018-04-04: 0.5 mg via INTRAVENOUS
  Filled 2018-04-04: qty 1

## 2018-04-04 MED ORDER — LIDOCAINE HCL (PF) 2 % IJ SOLN
INTRAMUSCULAR | Status: AC
  Filled 2018-04-04: qty 10

## 2018-04-04 MED ORDER — LIDOCAINE-EPINEPHRINE 1 %-1:100000 IJ SOLN
INTRAMUSCULAR | Status: AC
  Filled 2018-04-04: qty 1

## 2018-04-04 MED ORDER — LACTATED RINGERS IV SOLN
INTRAVENOUS | Status: DC
  Administered 2018-04-04 (×3): via INTRAVENOUS

## 2018-04-04 MED ORDER — ONDANSETRON HCL 4 MG/2ML IJ SOLN
INTRAMUSCULAR | Status: AC
  Filled 2018-04-04: qty 2

## 2018-04-04 MED ORDER — ACETAMINOPHEN 500 MG PO TABS
1000.0000 mg | ORAL_TABLET | Freq: Four times a day (QID) | ORAL | Status: DC
  Administered 2018-04-04 – 2018-04-05 (×4): 1000 mg via ORAL
  Filled 2018-04-04 (×5): qty 2

## 2018-04-04 MED ORDER — KETOROLAC TROMETHAMINE 30 MG/ML IJ SOLN
INTRAMUSCULAR | Status: AC
  Filled 2018-04-04: qty 1

## 2018-04-04 MED ORDER — ROCURONIUM BROMIDE 50 MG/5ML IV SOLN
INTRAVENOUS | Status: AC
  Filled 2018-04-04: qty 2

## 2018-04-04 MED ORDER — MENTHOL 3 MG MT LOZG
1.0000 | LOZENGE | OROMUCOSAL | Status: DC | PRN
  Filled 2018-04-04: qty 9

## 2018-04-04 MED ORDER — ZOLPIDEM TARTRATE 5 MG PO TABS
5.0000 mg | ORAL_TABLET | Freq: Every day | ORAL | Status: DC
  Administered 2018-04-04: 5 mg via ORAL
  Filled 2018-04-04: qty 1

## 2018-04-04 MED ORDER — ACETAMINOPHEN 500 MG PO TABS
ORAL_TABLET | ORAL | Status: AC
  Administered 2018-04-04: 1000 mg via ORAL
  Filled 2018-04-04: qty 2

## 2018-04-04 MED ORDER — HYDROMORPHONE HCL 1 MG/ML IJ SOLN
INTRAMUSCULAR | Status: AC
  Filled 2018-04-04: qty 1

## 2018-04-04 MED ORDER — MIDAZOLAM HCL 2 MG/2ML IJ SOLN
INTRAMUSCULAR | Status: AC
Start: 2018-04-04 — End: ?
  Filled 2018-04-04: qty 2

## 2018-04-04 MED ORDER — PROMETHAZINE HCL 25 MG/ML IJ SOLN: 6.2500 mg | INTRAMUSCULAR | Status: DC | PRN

## 2018-04-04 MED ORDER — DOCUSATE SODIUM 100 MG PO CAPS
100.0000 mg | ORAL_CAPSULE | Freq: Two times a day (BID) | ORAL | Status: DC
  Administered 2018-04-04 – 2018-04-05 (×3): 100 mg via ORAL
  Filled 2018-04-04 (×3): qty 1

## 2018-04-04 MED ORDER — FENTANYL CITRATE (PF) 100 MCG/2ML IJ SOLN
INTRAMUSCULAR | Status: DC | PRN
  Administered 2018-04-04 (×2): 50 ug via INTRAVENOUS

## 2018-04-04 MED ORDER — HYDROMORPHONE HCL 1 MG/ML IJ SOLN
INTRAMUSCULAR | Status: DC | PRN
  Administered 2018-04-04 (×2): 0.5 mg via INTRAVENOUS
  Administered 2018-04-04: .25 mg via INTRAVENOUS
  Administered 2018-04-04: 0.5 mg via INTRAVENOUS
  Administered 2018-04-04: .25 mg via INTRAVENOUS

## 2018-04-04 MED ORDER — MIDAZOLAM HCL 2 MG/2ML IJ SOLN
INTRAMUSCULAR | Status: DC | PRN
  Administered 2018-04-04: 2 mg via INTRAVENOUS

## 2018-04-04 MED ORDER — PROPOFOL 10 MG/ML IV BOLUS
INTRAVENOUS | Status: DC | PRN
  Administered 2018-04-04: 200 mg via INTRAVENOUS
  Administered 2018-04-04: 40 mg via INTRAVENOUS

## 2018-04-04 MED ORDER — FENTANYL CITRATE (PF) 100 MCG/2ML IJ SOLN
INTRAMUSCULAR | Status: AC
  Filled 2018-04-04: qty 2

## 2018-04-04 MED ORDER — ONDANSETRON HCL 4 MG/2ML IJ SOLN
INTRAMUSCULAR | Status: DC | PRN
  Administered 2018-04-04: 4 mg via INTRAVENOUS

## 2018-04-04 MED ORDER — ALUM & MAG HYDROXIDE-SIMETH 200-200-20 MG/5ML PO SUSP: 30.0000 mL | ORAL | Status: DC | PRN

## 2018-04-04 MED ORDER — LACTATED RINGERS IV SOLN
INTRAVENOUS | Status: DC
  Administered 2018-04-04 – 2018-04-05 (×3): via INTRAVENOUS

## 2018-04-04 MED ORDER — HYDROMORPHONE HCL 1 MG/ML IJ SOLN
0.2000 mg | INTRAMUSCULAR | Status: DC | PRN
  Administered 2018-04-04: 0.4 mg via INTRAVENOUS
  Administered 2018-04-04 (×3): 0.6 mg via INTRAVENOUS
  Administered 2018-04-05: 0.5 mg via INTRAVENOUS
  Filled 2018-04-04 (×5): qty 1

## 2018-04-04 MED ORDER — ONDANSETRON HCL 4 MG/2ML IJ SOLN: 4.0000 mg | Freq: Four times a day (QID) | INTRAMUSCULAR | Status: DC | PRN

## 2018-04-04 MED ORDER — CEFAZOLIN SODIUM-DEXTROSE 2-3 GM-%(50ML) IV SOLR
INTRAVENOUS | Status: AC
  Filled 2018-04-04: qty 50

## 2018-04-04 MED ORDER — PROPOFOL 10 MG/ML IV BOLUS
INTRAVENOUS | Status: AC
  Filled 2018-04-04: qty 20

## 2018-04-04 MED ORDER — GABAPENTIN 300 MG PO CAPS
900.0000 mg | ORAL_CAPSULE | ORAL | Status: AC
  Administered 2018-04-04: 900 mg via ORAL

## 2018-04-04 MED ORDER — SUGAMMADEX SODIUM 500 MG/5ML IV SOLN
INTRAVENOUS | Status: AC
Start: 2018-04-04 — End: ?
  Filled 2018-04-04: qty 5

## 2018-04-04 MED ORDER — ONDANSETRON HCL 4 MG PO TABS
4.0000 mg | ORAL_TABLET | Freq: Four times a day (QID) | ORAL | Status: DC | PRN
  Administered 2018-04-05: 4 mg via ORAL
  Filled 2018-04-04: qty 1

## 2018-04-04 MED ORDER — HYDROMORPHONE HCL 1 MG/ML IJ SOLN
0.2500 mg | INTRAMUSCULAR | Status: DC | PRN
  Administered 2018-04-04 (×2): 0.5 mg via INTRAVENOUS
  Administered 2018-04-04 (×2): 0.25 mg via INTRAVENOUS
  Administered 2018-04-04: 0.5 mg via INTRAVENOUS

## 2018-04-04 MED ORDER — LACTATED RINGERS IV SOLN: INTRAVENOUS | Status: DC

## 2018-04-04 MED ORDER — GABAPENTIN 300 MG PO CAPS
900.0000 mg | ORAL_CAPSULE | Freq: Every day | ORAL | Status: DC
  Administered 2018-04-04: 900 mg via ORAL
  Filled 2018-04-04: qty 3

## 2018-04-04 MED ORDER — HYDROCODONE-ACETAMINOPHEN 7.5-325 MG PO TABS: 1.0000 | ORAL_TABLET | Freq: Once | ORAL | Status: DC | PRN

## 2018-04-04 MED ORDER — DEXAMETHASONE SODIUM PHOSPHATE 10 MG/ML IJ SOLN
INTRAMUSCULAR | Status: DC | PRN
  Administered 2018-04-04: 10 mg via INTRAVENOUS

## 2018-04-04 MED ORDER — SUCCINYLCHOLINE CHLORIDE 20 MG/ML IJ SOLN
INTRAMUSCULAR | Status: AC
  Filled 2018-04-04: qty 1

## 2018-04-04 MED ORDER — IBUPROFEN 800 MG PO TABS
800.0000 mg | ORAL_TABLET | Freq: Three times a day (TID) | ORAL | Status: DC
  Administered 2018-04-04 – 2018-04-05 (×3): 800 mg via ORAL
  Filled 2018-04-04 (×3): qty 1

## 2018-04-04 MED ORDER — HYDROMORPHONE HCL 1 MG/ML IJ SOLN
INTRAMUSCULAR | Status: AC
  Administered 2018-04-04: 0.25 mg via INTRAVENOUS
  Filled 2018-04-04: qty 1

## 2018-04-04 MED ORDER — GABAPENTIN 300 MG PO CAPS
ORAL_CAPSULE | ORAL | Status: AC
  Administered 2018-04-04: 900 mg via ORAL
  Filled 2018-04-04: qty 3

## 2018-04-04 MED ORDER — ESTROGENS, CONJUGATED 0.625 MG/GM VA CREA
TOPICAL_CREAM | VAGINAL | Status: DC | PRN
  Administered 2018-04-04: 1 via VAGINAL

## 2018-04-04 MED ORDER — OXYCODONE HCL 5 MG PO TABS
5.0000 mg | ORAL_TABLET | ORAL | Status: DC | PRN
  Administered 2018-04-04 – 2018-04-05 (×5): 5 mg via ORAL
  Filled 2018-04-04 (×5): qty 1

## 2018-04-04 SURGICAL SUPPLY — 33 items
BAG URINE DRAINAGE (UROLOGICAL SUPPLIES) ×4 IMPLANT
CANISTER SUCT 1200ML W/VALVE (MISCELLANEOUS) ×4 IMPLANT
CATH FOLEY 2WAY  5CC 16FR (CATHETERS) ×2
CATH URTH 16FR FL 2W BLN LF (CATHETERS) ×2 IMPLANT
DRAPE PERI LITHO V/GYN (MISCELLANEOUS) ×4 IMPLANT
DRAPE SURG 17X11 SM STRL (DRAPES) ×4 IMPLANT
DRAPE UNDER BUTTOCK W/FLU (DRAPES) ×4 IMPLANT
ELECT REM PT RETURN 9FT ADLT (ELECTROSURGICAL) ×4
ELECTRODE REM PT RTRN 9FT ADLT (ELECTROSURGICAL) ×2 IMPLANT
GAUZE PACK 2X3YD (MISCELLANEOUS) ×4 IMPLANT
GLOVE BIO SURGEON STRL SZ7 (GLOVE) ×4 IMPLANT
GLOVE INDICATOR 7.5 STRL GRN (GLOVE) ×4 IMPLANT
GOWN STRL REUS W/ TWL LRG LVL3 (GOWN DISPOSABLE) ×6 IMPLANT
GOWN STRL REUS W/ TWL XL LVL3 (GOWN DISPOSABLE) ×2 IMPLANT
GOWN STRL REUS W/TWL LRG LVL3 (GOWN DISPOSABLE) ×6
GOWN STRL REUS W/TWL XL LVL3 (GOWN DISPOSABLE) ×2
KIT TURNOVER CYSTO (KITS) ×4 IMPLANT
LABEL OR SOLS (LABEL) ×4 IMPLANT
NEEDLE HYPO 22GX1.5 SAFETY (NEEDLE) ×4 IMPLANT
PACK BASIN MINOR ARMC (MISCELLANEOUS) ×4 IMPLANT
PAD OB MATERNITY 4.3X12.25 (PERSONAL CARE ITEMS) ×4 IMPLANT
PAD PREP 24X41 OB/GYN DISP (PERSONAL CARE ITEMS) ×4 IMPLANT
SLEEVE PROTECTION STRL DISP (MISCELLANEOUS) ×4 IMPLANT
SUT PDS 2-0 27IN (SUTURE) ×4 IMPLANT
SUT PDS AB 2-0 CT1 27 (SUTURE) ×4 IMPLANT
SUT VIC AB 0 CT1 27 (SUTURE) ×6
SUT VIC AB 0 CT1 27XCR 8 STRN (SUTURE) ×6 IMPLANT
SUT VIC AB 0 CT1 36 (SUTURE) ×8 IMPLANT
SUT VIC AB 2-0 SH 27 (SUTURE) ×12
SUT VIC AB 2-0 SH 27XBRD (SUTURE) ×12 IMPLANT
SYR 10ML LL (SYRINGE) ×4 IMPLANT
SYR CONTROL 10ML (SYRINGE) ×4 IMPLANT
WATER STERILE IRR 1000ML POUR (IV SOLUTION) ×4 IMPLANT

## 2018-04-04 NOTE — Op Note (Addendum)
Krista Allen PROCEDURE DATE: 04/04/2018  PREOPERATIVE DIAGNOSIS:   - Postmenopausal bleeding - Cervical stenosis - Hx of DVT, on xarelto  POSTOPERATIVE DIAGNOSIS:   Same - Anterior prolapse  SURGEON:   Christeen DouglasBethany Kaden Daughdrill, M.D. ASSISTANT: Ranae Plumberhelsea Ward, M.D. OPERATION:  Total Vaginal Hysterectomy, bilateral salpingectomy, cystocele repair  ANESTHESIA:  General endotracheal. Anesthesiologist: Anesthesiologist: Christia ReadingHowell, Scott T, MD CRNA: Irving BurtonBachich, Jennifer, CRNA; Stanton KidneyBatts, Kelly S, CRNA  INDICATIONS: The patient is a 52 y.o. F with hx of PMB and DVT. The patient made a decision to undergo definite surgical treatment. On the preoperative visit, the risks, benefits, indications, and alternatives of the procedure were reviewed with the patient.  On the day of surgery, the risks of surgery were again discussed with the patient including but not limited to: bleeding which may require transfusion or reoperation; infection which may require antibiotics; injury to bowel, bladder, ureters or other surrounding organs; need for additional procedures; thromboembolic phenomenon, incisional problems and other postoperative/anesthesia complications. Written informed consent was obtained.    OPERATIVE FINDINGS: A 6 week size uterus with normal tubes and ovaries bilaterally. Anterior prolapse, posterior prolapse.  ESTIMATED BLOOD LOSS: 75 ml FLUIDS:  2000 ml of Lactated Ringers URINE OUTPUT:  300 ml of clear yellow urine. SPECIMENS:  Uterus and cervix and tube sent to pathology COMPLICATIONS:  None immediate.  DESCRIPTION OF PROCEDURE:  The patient received prophylactic intravenous antibiotics and had sequential compression devices applied to her lower extremities while in the preoperative area.    She was taken to the operating room, where she was identified by name and birth date. General anesthesia was administered and was found to be adequate.  She was placed in the dorsal lithotomy position, and was  prepped and draped in a sterile manner.  A formal time out procedure was performed with all team members present and in agreement. A Foley catheter was inserted into her bladder and attached to gravity drainage. Attention was turned to her pelvis. Of note, all sutures used in this case were 0 Vicryl unless otherwise noted.     A weighted speculum was placed in the vagina, and the anterior and posterior lips of the cervix were grasped bilaterally with thyroid tenaculums.  The cervix was then injected circumferentially with 0.25% Marcaine with epinephrine solution to maintain hemostasis.  The cervix was circumferentially incised using electrocautery, and the posterior cul-de-sac was entered sharply in the midline.   A long weighted speculum was inserted into the posterior cul-de-sac. The bladder was dissected off the pubocervical fascia anteriorly with sharp and careful blunt dissection without complication.  The anterior cul-de-sac was then entered sharply without difficulty and the bladder retracted out of the operative field behind a retractor. The Heaney clamp was then used to clamp the uterosacral ligaments on either side.  They were then cut and sutured ligated with 0 Vicryl, and the ligated uterosacral ligaments were transfixed to the ipsilateral vaginal epithelium to further support the vagina and provide hemostasis. The cardinal ligaments were then clamped, cut and ligated. The uterine vessels and broad ligaments were then serially clamped with the Heaney clamps, cut, and suture ligated on both sides.  Excellent hemostasis was noted at this point.    The uterus was then delivered via the posterior cul-de-sac, and the cornua were clamped with the Heaney clamps, transected, and the uterine specimen was delivered and sent to pathology. These pedicles were then suture ligated to ensure hemostasis.   BILATERAL SALPINGECTOMY PARAGRAPH The Fallopian tubes were then identified and  individually grasped with  Babcock clamps. They were clamped across entirely with a Heaney clamp and free-tied, followed by suture ligation for excellent hemostasis bilaterally. The ovaries were left intact and in place.  After completion of the hysterectomy, all pedicles from the uterosacral ligament to the cornua were examined hemostasis was confirmed.  The peritoneum was closed in a purse string fashion with 2-0 PDS, taking care not to incorporate any intraabdominal organs in the closure.  Due to the anterior prolapse, visualization of the cuff was difficult, and a small anterior repair was performed over the bladder to plicate it internally, being careful not to include the bladder serosa in the repair. Approximately 3cm of repair was completed.  The vaginal cuff was then closed with a running locked fashion with care given to incorporate the uterosacral pedicles bilaterally, which were also tied in the midline.  All instruments were then removed from the pelvis.  The vagina was packed to maintain pressure on the cuff, and a Foley cath left in place. This will be removed in24hrs.  She was not given NSAIDs during the procedure.  The patient tolerated the procedure well.  All instruments, needles, and sponge counts were correct x 2. The patient was taken to the recovery room in stable condition.    Cline Cools, MD, MPH

## 2018-04-04 NOTE — Transfer of Care (Signed)
Immediate Anesthesia Transfer of Care Note  Patient: Carolynn Commentlizabeth J Bina  Procedure(s) Performed: HYSTERECTOMY VAGINAL (N/A Vagina ) Bilateral SALPINGECTOMY (Bilateral Vagina )  Patient Location: PACU  Anesthesia Type:General  Level of Consciousness: awake, alert  and oriented  Airway & Oxygen Therapy: Patient Spontanous Breathing and Patient connected to face mask oxygen  Post-op Assessment: Report given to RN and Post -op Vital signs reviewed and stable  Post vital signs: stable  Last Vitals:  Vitals Value Taken Time  BP 134/90 04/04/2018 10:32 AM  Temp 36.4 C 04/04/2018 10:32 AM  Pulse 95 04/04/2018 10:32 AM  Resp 15 04/04/2018 10:32 AM  SpO2 100 % 04/04/2018 10:32 AM  Vitals shown include unvalidated device data.  Last Pain:  Vitals:   04/04/18 0609  TempSrc: Tympanic  PainSc: 0-No pain         Complications: No apparent anesthesia complications

## 2018-04-04 NOTE — Anesthesia Preprocedure Evaluation (Addendum)
Anesthesia Evaluation  Patient identified by MRN, date of birth, ID band Patient awake    Reviewed: Allergy & Precautions, H&P , NPO status , reviewed documented beta blocker date and time   Airway Mallampati: II  TM Distance: >3 FB Neck ROM: full    Dental  (+) Chipped, Missing   Pulmonary former smoker,    Pulmonary exam normal        Cardiovascular Normal cardiovascular exam     Neuro/Psych  Headaches,    GI/Hepatic Neg liver ROS, neg GERD  Controlled,  Endo/Other  negative endocrine ROS  Renal/GU negative Renal ROS     Musculoskeletal  (+) Arthritis ,   Abdominal   Peds  Hematology negative hematology ROS (+)   Anesthesia Other Findings   Reproductive/Obstetrics                            Anesthesia Physical Anesthesia Plan  ASA: II  Anesthesia Plan: General   Post-op Pain Management:    Induction:   PONV Risk Score and Plan: 3 and Propofol infusion  Airway Management Planned:   Additional Equipment:   Intra-op Plan:   Post-operative Plan:   Informed Consent: I have reviewed the patients History and Physical, chart, labs and discussed the procedure including the risks, benefits and alternatives for the proposed anesthesia with the patient or authorized representative who has indicated his/her understanding and acceptance.   Dental Advisory Given  Plan Discussed with: CRNA  Anesthesia Plan Comments:        Anesthesia Quick Evaluation

## 2018-04-04 NOTE — Anesthesia Post-op Follow-up Note (Signed)
Anesthesia QCDR form completed.        

## 2018-04-04 NOTE — Interval H&P Note (Signed)
History and Physical Interval Note:  04/04/2018 7:41 AM  Carolynn CommentElizabeth J Waldo  has presented today for surgery, with the diagnosis of AUB  The various methods of treatment have been discussed with the patient and family. After consideration of risks, benefits and other options for treatment, the patient has consented to  Procedure(s): HYSTERECTOMY VAGINAL (N/A) BILATERAL SALPINGECTOMY (Bilateral) as a surgical intervention .  The patient's history has been reviewed, patient examined, no change in status, stable for surgery.  I have reviewed the patient's chart and labs.  Questions were answered to the patient's satisfaction.    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 However, she did not and did take her Xeralto yesterday. Her risk for clot is low, which is likely what her PCP meant when he told her she didn't need to bridge. She is aware of the risk of bleeding that is difficult to control, and we will take care to avoid bleeding. She has been type and crossed.    Christeen DouglasBethany Shaynna Husby

## 2018-04-04 NOTE — Progress Notes (Signed)
This RN spoke with Dr. Dalbert GarnetBeasley about patient concerns regarding resuming home medications, new orders received. Patient concerns addressed, continue to assess.

## 2018-04-04 NOTE — Anesthesia Postprocedure Evaluation (Signed)
Anesthesia Post Note  Patient: Carolynn Commentlizabeth J Donaghey  Procedure(s) Performed: HYSTERECTOMY VAGINAL (N/A Vagina ) Bilateral SALPINGECTOMY (Bilateral Vagina )  Patient location during evaluation: PACU Anesthesia Type: General Level of consciousness: awake and alert Pain management: pain level controlled Vital Signs Assessment: post-procedure vital signs reviewed and stable Respiratory status: spontaneous breathing, nonlabored ventilation and respiratory function stable Cardiovascular status: blood pressure returned to baseline and stable Postop Assessment: no apparent nausea or vomiting Anesthetic complications: no     Last Vitals:  Vitals:   04/04/18 1113 04/04/18 1139  BP:  129/90  Pulse: 87 86  Resp: 14 16  Temp: (!) 36.2 C 36.8 C  SpO2: 98% 91%    Last Pain:  Vitals:   04/04/18 1139  TempSrc: Oral  PainSc:                  Christia ReadingScott T Tysha Grismore

## 2018-04-04 NOTE — Anesthesia Procedure Notes (Signed)
Procedure Name: LMA Insertion Date/Time: 04/04/2018 7:58 AM Performed by: Stanton KidneyBatts, Adrean Findlay S, CRNA Pre-anesthesia Checklist: Patient identified, Patient being monitored, Timeout performed, Emergency Drugs available and Suction available Patient Re-evaluated:Patient Re-evaluated prior to induction Oxygen Delivery Method: Circle system utilized Preoxygenation: Pre-oxygenation with 100% oxygen Induction Type: IV induction Ventilation: Mask ventilation without difficulty LMA: LMA inserted LMA Size: 4.0 Tube type: Oral Number of attempts: 1 Placement Confirmation: positive ETCO2 and breath sounds checked- equal and bilateral Tube secured with: Tape Dental Injury: Teeth and Oropharynx as per pre-operative assessment

## 2018-04-05 ENCOUNTER — Encounter: Payer: Self-pay | Admitting: Obstetrics and Gynecology

## 2018-04-05 DIAGNOSIS — N950 Postmenopausal bleeding: Secondary | ICD-10-CM | POA: Diagnosis not present

## 2018-04-05 LAB — CBC
HCT: 35.8 % (ref 35.0–47.0)
HEMOGLOBIN: 12 g/dL (ref 12.0–16.0)
MCH: 29.9 pg (ref 26.0–34.0)
MCHC: 33.6 g/dL (ref 32.0–36.0)
MCV: 89 fL (ref 80.0–100.0)
Platelets: 241 10*3/uL (ref 150–440)
RBC: 4.02 MIL/uL (ref 3.80–5.20)
RDW: 14.7 % — ABNORMAL HIGH (ref 11.5–14.5)
WBC: 8 10*3/uL (ref 3.6–11.0)

## 2018-04-05 LAB — BASIC METABOLIC PANEL
ANION GAP: 5 (ref 5–15)
BUN: 13 mg/dL (ref 6–20)
CO2: 26 mmol/L (ref 22–32)
CREATININE: 0.81 mg/dL (ref 0.44–1.00)
Calcium: 8.3 mg/dL — ABNORMAL LOW (ref 8.9–10.3)
Chloride: 106 mmol/L (ref 101–111)
GFR calc non Af Amer: >60 mL/min (ref >60)
Glucose, Bld: 116 mg/dL — ABNORMAL HIGH (ref 65–99)
Potassium: 3.9 mmol/L (ref 3.5–5.1)
SODIUM: 137 mmol/L (ref 135–145)

## 2018-04-05 MED ORDER — IBUPROFEN 800 MG PO TABS: 800.0000 mg | ORAL_TABLET | Freq: Three times a day (TID) | ORAL | 0 refills | Status: AC

## 2018-04-05 MED ORDER — ACETAMINOPHEN 500 MG PO TABS: 1000.0000 mg | ORAL_TABLET | Freq: Four times a day (QID) | ORAL | 0 refills | Status: AC

## 2018-04-05 MED ORDER — GABAPENTIN 300 MG PO CAPS: 900.0000 mg | ORAL_CAPSULE | Freq: Every day | ORAL | 0 refills | Status: DC

## 2018-04-05 MED ORDER — SIMETHICONE 80 MG PO CHEW: 80.0000 mg | CHEWABLE_TABLET | Freq: Four times a day (QID) | ORAL | 0 refills | Status: DC | PRN

## 2018-04-05 MED ORDER — OXYCODONE HCL 5 MG PO TABS: 5.0000 mg | ORAL_TABLET | ORAL | 0 refills | Status: DC | PRN

## 2018-04-05 MED ORDER — ALUM & MAG HYDROXIDE-SIMETH 200-200-20 MG/5ML PO SUSP: 30.0000 mL | ORAL | 0 refills | Status: AC | PRN

## 2018-04-05 MED ORDER — MENTHOL 3 MG MT LOZG: 1.0000 | LOZENGE | OROMUCOSAL | 12 refills | Status: DC | PRN

## 2018-04-05 NOTE — Plan of Care (Addendum)
Vs stable; tolerating regular diet; has had iv dilaudid for pain just twice this shift;taking PO oxycodone (pt says this "workd better for my pain"); has had PO motrin and PO tylenol as well for pain control; foley draining urine (adequate color and amount); MD to remove vaginal packing when she rounds in the a.m then the nurse can remove the foley catheter

## 2018-04-05 NOTE — Discharge Summary (Addendum)
1 Day Post-Op       Procedure(s): HYSTERECTOMY VAGINAL (N/A) Bilateral SALPINGECTOMY (Bilateral) Subjective: The patient is doing well.  No nausea or vomiting. Pain is adequately controlled. Packing removed and Foley d/c.  Objective: Vital signs in last 24 hours: Temp:  [97.5 F (36.4 C)-98 F (36.7 C)] 97.9 F (36.6 C) (04/09 1158) Pulse Rate:  [68-88] 68 (04/09 1158) Resp:  [16-20] 18 (04/09 1158) BP: (93-130)/(65-83) 123/82 (04/09 1158) SpO2:  [96 %-98 %] 98 % (04/09 0734)  Intake/Output  Intake/Output Summary (Last 24 hours) at 04/05/2018 1318 Last data filed at 04/05/2018 1023 Gross per 24 hour  Intake 2558.67 ml  Output 2350 ml  Net 208.67 ml    Physical Exam:  General: Alert and oriented. CV: RRR Lungs: Clear bilaterally. GI: Soft, Nondistended. Incisions: Clean and dry. Urine: Clear, Foley in place Extremities: Nontender, no erythema, no edema.  Lab Results: Recent Labs    04/05/18 0639  HGB 12.0  HCT 35.8  WBC 8.0  PLT 241                 Results for orders placed or performed during the hospital encounter of 04/04/18 (from the past 24 hour(s))  CBC     Status: Abnormal   Collection Time: 04/05/18  6:39 AM  Result Value Ref Range   WBC 8.0 3.6 - 11.0 K/uL   RBC 4.02 3.80 - 5.20 MIL/uL   Hemoglobin 12.0 12.0 - 16.0 g/dL   HCT 72.535.8 36.635.0 - 44.047.0 %   MCV 89.0 80.0 - 100.0 fL   MCH 29.9 26.0 - 34.0 pg   MCHC 33.6 32.0 - 36.0 g/dL   RDW 34.714.7 (H) 42.511.5 - 95.614.5 %   Platelets 241 150 - 440 K/uL  Basic metabolic panel     Status: Abnormal   Collection Time: 04/05/18  6:39 AM  Result Value Ref Range   Sodium 137 135 - 145 mmol/L   Potassium 3.9 3.5 - 5.1 mmol/L   Chloride 106 101 - 111 mmol/L   CO2 26 22 - 32 mmol/L   Glucose, Bld 116 (H) 65 - 99 mg/dL   BUN 13 6 - 20 mg/dL   Creatinine, Ser 3.870.81 0.44 - 1.00 mg/dL   Calcium 8.3 (L) 8.9 - 10.3 mg/dL   GFR calc non Af Amer >60 >60 mL/min   GFR calc Af Amer >60 >60 mL/min   Anion gap 5 5 - 15     Current Facility-Administered Medications:  .  acetaminophen (TYLENOL) tablet 1,000 mg, 1,000 mg, Oral, Q6H, Christeen DouglasBeasley, Kenleigh Toback, MD, 1,000 mg at 04/05/18 0736 .  alum & mag hydroxide-simeth (MAALOX/MYLANTA) 200-200-20 MG/5ML suspension 30 mL, 30 mL, Oral, Q4H PRN, Christeen DouglasBeasley, Sanders Manninen, MD .  docusate sodium (COLACE) capsule 100 mg, 100 mg, Oral, BID, Christeen DouglasBeasley, Margaret Cockerill, MD, 100 mg at 04/05/18 1056 .  gabapentin (NEURONTIN) capsule 900 mg, 900 mg, Oral, QHS, Christeen DouglasBeasley, Terra Aveni, MD, 900 mg at 04/04/18 2040 .  HYDROmorphone (DILAUDID) injection 0.2-0.6 mg, 0.2-0.6 mg, Intravenous, Q2H PRN, Christeen DouglasBeasley, Kimberlyann Hollar, MD, 0.5 mg at 04/05/18 0824 .  ibuprofen (ADVIL,MOTRIN) tablet 800 mg, 800 mg, Oral, Q8H, Christeen DouglasBeasley, Zebulon Gantt, MD, 800 mg at 04/05/18 1056 .  menthol-cetylpyridinium (CEPACOL) lozenge 3 mg, 1 lozenge, Oral, Q2H PRN, Christeen DouglasBeasley, Cadince Hilscher, MD .  oxyCODONE (Oxy IR/ROXICODONE) immediate release tablet 5 mg, 5 mg, Oral, Q4H PRN, Christeen DouglasBeasley, Laxmi Choung, MD, 5 mg at 04/05/18 [id number] .  simethicone (MYLICON) chewable tablet 80 mg, 80 mg, Oral, QID PRN, Christeen DouglasBeasley, Ronnae Kaser, MD, 80 mg  at 04/05/18 1610 Assessment/Plan: 1 Day Post-Op       Procedure(s): HYSTERECTOMY VAGINAL (N/A) Bilateral SALPINGECTOMY (Bilateral)  1) Ambulate, Incentive spirometry 2) Advance diet as tolerated 3) Transition to oral pain medication 4) D/C Foley 5) Discharge home today anticipated    Christeen Douglas, MD   LOS: 0 days   Christeen Douglas 04/05/2018, 1:18 PM

## 2018-04-05 NOTE — Progress Notes (Signed)
Patient discharged home with husband Discharge instructions and prescriptions given and reviewed with patient. Patient verbalized understanding. Escorted out by auxillary.  

## 2018-04-05 NOTE — Discharge Instructions (Signed)
Discharge instructions after  vaginal hysterectomy  For the next three days, take ibuprofen and acetaminophen on a schedule, every 8 hours. You can take them together or you can intersperse them, and take one every four hours. I also gave you gabapentin for nighttime, to help you sleep and also to control pain. Take gabapentin medicines at night for at least the next 3 nights. You also have a narcotic, oxycodone, to take as needed if the above medicines don't help.  Postop constipation is a major cause of pain. Stay well hydrated, walk as you tolerate, and take over the counter senna as well as stool softeners if you need them.   Signs and Symptoms to Report Call our office at [phone] if you have any of the following.   Fever over 100.4 degrees or higher  Severe stomach pain not relieved with pain medications  Bright red bleeding thats heavier than a period that does not slow with rest  To go the bathroom a lot (frequency), you cant hold your urine (urgency), or it hurts when you empty your bladder (urinate)  Chest pain  Shortness of breath  Pain in the calves of your legs  Severe nausea and vomiting not relieved with anti-nausea medications  Signs of infection around your wounds, such as redness, hot to touch, swelling, green/yellow drainage (like pus), bad smelling discharge  Any concerns  What You Can Expect after Surgery  You may see some pink tinged, bloody fluid and bruising around the wound. This is normal.  You may have a sore throat because of the tube in your mouth during general anesthesia. This will go away in 2 to 3 days.  You may have some stomach cramps.  You may notice spotting on your panties.   Activities after Your Discharge Follow these guidelines to help speed your recovery at home:  Do the coughing and deep breathing as you did in the hospital for 2 weeks. Use the small blue breathing device, called the incentive spirometer for 2 weeks.   Dont drive if you are in pain or taking narcotic pain medicine. You may drive when you can safely slam on the brakes, turn the wheel forcefully, and rotate your torso comfortably. This is typically 1-2 weeks. Practice in a parking lot or side street prior to attempting to drive regularly.   Ask others to help with household chores for 4 weeks.  Do not lift anything heavier that 10 pounds for 4-6 weeks. This includes pets, children, and groceries.  Dont do strenuous activities, exercises, or sports like vacuuming, tennis, squash, etc. until your doctor says it is safe to do so. ---Maintain pelvic rest for 8 weeks. This means nothing in the vagina or rectum at all (no douching, tampons, intercourse) for 8 weeks.   Walk as you feel able. Rest often since it may take two or three weeks for your energy level to return to normal.   You may climb stairs  Avoid constipation:   -Eat fruits, vegetables, and whole grains. Eat small meals as your appetite will take time to return to normal.   -Drink 6 to 8 glasses of water each day unless your doctor has told you to limit your fluids.   -Use a laxative or stool softener as needed if constipation becomes a problem. You may take Miralax, metamucil, Citrucil, Colace, Senekot, FiberCon, etc. If this does not relieve the constipation, try two tablespoons of Milk Of Magnesia every 8 hours until your bowels move.  You may shower. Gently wash the wounds with a mild soap and water. Pat dry.  Do not get in a hot tub, swimming pool, etc. for 6 weeks.  Do not use lotions, oils, powders on the wounds.  Do not douche, use tampons, or have sex until your doctor says it is okay.  Take your pain medicine when you need it. The medicine may not work as well if the pain is bad.  Take the medicines you were taking before surgery. Other medications you will need are pain medications (Norco or Percocet) and nausea medications (Zofran).      Here is a helpful  article from the website http://mitchell.org/, regarding constipation  Here are reasons why constipation occurs after surgery: 1) During the operation and in the recovery room, most people are given opioid pain medication, primarily through an IV, to treat moderate or severe pain. Intravenous opioids include morphine, Dilaudid and fentanyl. After surgery, patients are often prescribed opioid pain medication to take by mouth at home, including codeine, Vicodin, Norco, and Percocet. All of these medications cause constipation by slowing down the movement of your intestine. 2) Changes in your diet before surgery can be another culprit. It is common to get specific instructions to change how you normally eat or drink before your surgery, like only having liquids the day before or not having anything to eat or drink after midnight the night before surgery. For this reason, temporary dehydration may occur. This, along with not eating or only having liquids, means that you are getting less fiber than usual. Both these factors contribute to constipation. 3) Changes in your diet after surgery can also contribute to the problem. Although many people dont have dietary restrictions after operations, being under anesthesia can make you lose your appetite for several hours and maybe even days. Some people can even have nausea or vomiting. Not eating or drinking normally means that you are not getting enough fiber and you can get dehydrated, both leading to constipation. 4) Lying in a bed more than usual--which happens before, during and after surgery--combined with the medications and diet changes, all work together to slow down your colon and make your poop turn to rock.  No one likes to be constipated.  Lets face it, its not a pleasant feeling when you dont poop for days, then strain on the toilet to finally pass something large enough to cause damage. An ounce of prevention is worth a pound of cure, so: 1. Assume you  will be constipated. 2. Plan and prepare accordingly. Post-surgery is one of those unique situations where the temporary use of laxatives can make a world of difference. Always consult with your doctor, and recognize that if you wait several days after surgery to take a laxative, the constipation might be too severe for these over-the-counter options. It is always important to discuss all medications you plan on taking with your doctor. Ask your doctor if you can start the laxative immediately after surgery. *  Here are go-to post-surgery laxatives: Senna: Senna is an herb that acts as a stimulant laxative, meaning it increases the activity of the intestine to cause you to have a bowel movement. It comes in many forms, but senna pills are easy to take and are sold over the counter at almost all pharmacies. Since opioid pain medications slow down the activity of the intestine, it makes sense to take a medication to help reverse that side effect. Long-term use of a stimulant laxative is not  a good idea since it can make your colon lazy and not function properly; however, temporary use immediately after surgery is acceptable. In general, if you are able to eat a normal diet, taking senna soon after surgery works the best. Senna usually works within hours to produce a bowel movement, but this is less predictable when you are taking different medications after surgery. Try not to wait several days to start taking senna, as often it is too late by then. Just like with all medications or supplements, check with your doctor before starting new treatment.   Magnesium: Magnesium is an important mineral that our body needs. We get magnesium from some foods that we eat, especially foods that are high in fiber such as broccoli, almonds and whole grains. There are also magnesium-based medications used to treat constipation including milk of magnesia (magnesium hydroxide), magnesium citrate and magnesium oxide. They  work by drawing water into the intestine, putting it into the class of osmotic laxatives. Magnesium products in low doses appear to be safe, but if taken in very large doses, can lead to problems such as irregular heartbeat, low blood pressure and even death. It can also affect other medications you might be taking, therefore it is important to discuss using magnesium with your physician and pharmacist before initiating therapy. Most over-the-counter magnesium laxatives work very well to help with the constipation related to surgery, but sometimes they work too well and lead to diarrhea. Make sure you are somewhere with easy access to a bathroom, just in case.   Bisacodyl: Bisacodyl (generic name) is sold under brand names such as Dulcolax. Much like senna, it is a stimulant laxative, meaning it makes your intestines move more quickly to push out the stool. This is another good choice to start taking as soon as your doctor says you can take a laxative after surgery. It comes in pill form and as a suppository, which is a good choice for people who cannot or are not allowed to swallow pills. Studies have shown that it works as a laxative, but like most of these medications, you should use this on a short-term basis only.   Enema: Enemas strike fear in many people, but FEAR NOT! Its nowhere near as big a deal as you may think. An enema is just a way to get some liquid into your rectum by placing a specially designed device through your anus. If you have never done one, it might seem like a painful, unpleasant, uncomfortable, complicated and lengthy procedure. But in reality, its simple, takes just a few seconds and is highly effective. The small ready-made bottles you buy at the pharmacy are much easier than the hose/large rubber container type. Those recommended positions illustrated in some instructions are generally not necessary to place the enema. Its very similar to the insertion of a tampon,  requiring a slight squat. Some extra lubrication on the enemas tip (or on your anus) will make it a breeze. In certain cases, there is no substitute for a good enema. For example, if someone has not pooped for a few days, the beginning of the poop waiting to come out can become rock hard. Passing that hard stool can lead to much pain and problems like anal fissures. Inserting a little liquid to break up the rock-hard stool will help make its passage much easier. Enemas come with different liquids. Most come with saline, but there are also mineral oil options. You can also use warm water in the reusable  enema containers. They all work. But since saline can sometimes be irritating, so try a mineral oil or water enema instead.  Here are commonly recommended constipation medications that do not work well for post-surgery constipation: Docusate: Docusate (generic name) most commonly referred to as Colace (brand name) is not really a laxative, but is classified as a stool softener. Although this medication is commonly prescribed, it is not recommended for several reasons: 1) there is no good medical evidence that it works 2) even if it has an effect, which is very questionable, it is minimal and cannot combat the intestinal slowing caused by the opioid medications. Skip docusate to save money and space in your pillbox for something more effective.  PEG: Miralax (brand name) is basically a chemical called polyethylene glycol (PEG) and it has gained tremendous popularity as a laxative. This product is an osmotic laxative meaning it works by pulling water into the stool, making it softer. This is very similar to the action of natural fiber in foods and supplements. Therefore, the effect seen by this medication is not immediate, causing a bowel movement in a day or more. Is this medication strong enough to battle the constipation related to having an operation? Maybe for some people not prone to constipation. But for  most people, other laxatives are better to prevent constipation after surgery.

## 2018-04-08 LAB — SURGICAL PATHOLOGY

## 2018-11-17 ENCOUNTER — Other Ambulatory Visit: Payer: Self-pay

## 2018-11-17 ENCOUNTER — Encounter
Admission: RE | Admit: 2018-11-17 | Discharge: 2018-11-17 | Disposition: A | Discharge status: Home / Self Care | Payer: 59 | Source: Ambulatory Visit | Attending: Obstetrics and Gynecology | Admitting: Obstetrics and Gynecology

## 2018-11-17 ENCOUNTER — Other Ambulatory Visit: Payer: Self-pay | Admitting: *Deleted

## 2018-11-17 MED ORDER — CLINDAMYCIN PHOS-BENZOYL PEROX 1.2-5 % EX GEL: 1.0000 "application " | Freq: Two times a day (BID) | CUTANEOUS | 3 refills | Status: DC

## 2018-11-17 NOTE — H&P (Signed)
Chief Complaint: continued spotting after TVH, BS on 04/04/18 for PMB  History of Present Illness:  Krista Allen is a 52 y.o. [id number] who presents for vaginal examination   Continued bleeding since hysterectomy after intercourse, sometimes bright and sometimes dark.  +pain with intercourse  Continued pain and bleeding, intermittently.  I saw her after surgery for same and cauterized with silver nitrate    Pathology:  DIAGNOSIS: A. 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 - WEAKLY PROLIFERATIVE ENDOMETRIUM. - UNREMARKABLE MYOMETRIUM. - TWO FALLOPIAN TUBES WITH FIMBRIATED END. - NEGATIVE FOR HIGH GRADE SQUAMOUS INTRAEPITHELIAL LESION AND CARCINOMA.    The following portions of the patient's history were reviewed and updated as appropriate. Past Medical History:  has a past medical history of Diverticulitis, IBS (irritable bowel syndrome), Migraine, Osteoarthritis, Seasonal allergies, and Vitamin B 12 deficiency. Problem List: has IBS (irritable bowel syndrome); Vitamin B 12 deficiency; Osteoarthritis; Diverticulitis; Status post total right knee replacement using cement; Acute deep vein thrombosis (DVT) of proximal vein of right lower extremity (CMS-HCC); Decreased range of motion (ROM) of right knee; PMB (postmenopausal bleeding); and Primary osteoarthritis of first carpometacarpal joint of right hand on their problem list. 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   Medications:        Current Outpatient Medications on File Prior to Visit  Medication Sig Dispense Refill  . ALPRAZolam (XANAX) 0.5 MG tablet Take 1 tablet (0.5 mg total) by mouth 2 (two) times daily as needed for Sleep or Anxiety 60 tablet 3  . gabapentin (NEURONTIN) 100 MG capsule TAKE 1 CAPSULE BY MOUTH 3 TIMES DAILY.  11  . phentermine (ADIPEX-P) 37.5 mg tablet TAKE 1 TABLET BY MOUTH EVERY MORNING BEFORE BREAKFAST 30 tablet 0  . QUEtiapine (SEROQUEL) 25 MG tablet TAKE ONE TABLET BY MOUTH EVERY NIGHT AT BEDTIME 30 tablet 1  . venlafaxine (EFFEXOR-XR) 150 MG XR capsule TAKE 1 CAPSULE BY MOUTH EVERY DAY 30 capsule 11  . venlafaxine (EFFEXOR-XR) 75 MG XR capsule TAKE 1 CAPSULE BY MOUTH EVERY DAY 30 capsule 11            Current Facility-Administered Medications on File Prior to Visit  Medication Dose Route Frequency Provider Last Rate Last Dose  . cyanocobalamin (VITAMIN B12) injection 1,000 mcg  1,000 mcg Intramuscular Q30 Days Marguarite Arbour, MD   1,000 mcg at 10/18/18 1524   Allergies: Tramadol  Physical Exam: BP 118/86   Pulse 98   Ht 156.2 cm (5' 1.5")   Wt 64.4 kg (142 lb)   LMP 09/09/2016 (Approximate)   BMI 26.40 kg/m   General appearance: Well nourished, well developed female in no acute distress.   Vaginal cuff: Larger pink frond-like friable tissue 2cm in total very TTP along vaginal cuff. Biopsy taken with anesthesia local  Neuro/Psych:  Normal  mood and affect.   Chemical cautery of this spot with silver nitrate after biopsy   Assessment/plan:   Vaginal friability tissue on scar. I did remove cervix (hx  of CIN I) and both tubes. Silver nitrate on this area.   F/u biopsy Plan for LEEP revision under anesthesia as soon as possible

## 2018-11-17 NOTE — Patient Instructions (Signed)
Your procedure is scheduled on:Mon 11/25 Report to Day Surgery. To find out your arrival time please call [phone] between 1PM - 3PM on Friday 11/18/18  Remember: Instructions that are not followed completely may result in serious medical risk,  up to and including death, or upon the discretion of your surgeon and anesthesiologist your  surgery may need to be rescheduled.     _X__ 1. Do not eat food after midnight the night before your procedure.                 No gum chewing or hard candies. You may drink clear liquids up to 2 hours                 before you are scheduled to arrive for your surgery- DO not drink clear                 liquids within 2 hours of the start of your surgery.                 Clear Liquids include:  water, apple juice without pulp, clear carbohydrate                 drink such as Clearfast of Gatorade, Black Coffee or Tea (Do not add                 anything to coffee or tea).  __X__2.  On the morning of surgery brush your teeth with toothpaste and water, you                may rinse your mouth with mouthwash if you wish.  Do not swallow any toothpaste of mouthwash.     _X__ 3.  No Alcohol for 24 hours before or after surgery.   ___ 4.  Do Not Smoke or use e-cigarettes For 24 Hours Prior to Your Surgery.                 Do not use any chewable tobacco products for at least 6 hours prior to                 surgery.  ____  5.  Bring all medications with you on the day of surgery if instructed.   ____  6.  Notify your doctor if there is any change in your medical condition      (cold, fever, infections).     Do not wear jewelry, make-up, hairpins, clips or nail polish. Do not wear lotions, powders, or perfumes. You may wear deodorant. Do not shave 48 hours prior to surgery. Men may shave face and neck. Do not bring valuables to the hospital.    [facility] is not responsible for any belongings or valuables.  Contacts,  dentures or bridgework may not be worn into surgery. Leave your suitcase in the car. After surgery it may be brought to your room. For patients admitted to the hospital, discharge time is determined by your treatment team.   Patients discharged the day of surgery will not be allowed to drive home.   Please read over the following fact sheets that you were given:    __x__ Take these medicines the morning of surgery with A SIP OF WATER:    1. venlafaxine XR (EFFEXOR-XR) 150 MG 24 hr capsule venlafaxine XR (EFFEXOR-XR) 75 MG 24 hr capsule  2.   3.   4.  5.  6.  ____ Fleet Enema (as directed)  ____ Use CHG Soap as directed  ____ Use inhalers on the day of surgery  ____ Stop metformin 2 days prior to surgery    ____ Take 1/2 of usual insulin dose the night before surgery. No insulin the morning          of surgery.   __x__ Stop aspirin today  __x__ Stop Anti-inflammatories ibuprofen and aleve today   ____ Stop supplements until after surgery.    ____ Bring C-Pap to the hospital.

## 2018-11-21 ENCOUNTER — Encounter: Payer: Self-pay | Admitting: *Deleted

## 2018-11-21 ENCOUNTER — Other Ambulatory Visit: Payer: Self-pay

## 2018-11-21 ENCOUNTER — Ambulatory Visit
Admission: RE | Admit: 2018-11-21 | Discharge: 2018-11-21 | Disposition: A | Discharge status: Home / Self Care | Payer: 59 | Source: Ambulatory Visit | Attending: Obstetrics and Gynecology | Admitting: Obstetrics and Gynecology

## 2018-11-21 ENCOUNTER — Ambulatory Visit: Payer: 59 | Admitting: Anesthesiology

## 2018-11-21 ENCOUNTER — Encounter
Admission: RE | Disposition: A | Discharge status: Home / Self Care | Payer: Self-pay | Source: Ambulatory Visit | Attending: Obstetrics and Gynecology

## 2018-11-21 DIAGNOSIS — Z96651 Presence of right artificial knee joint: Secondary | ICD-10-CM | POA: Insufficient documentation

## 2018-11-21 DIAGNOSIS — E538 Deficiency of other specified B group vitamins: Secondary | ICD-10-CM | POA: Diagnosis not present

## 2018-11-21 DIAGNOSIS — Z90710 Acquired absence of both cervix and uterus: Secondary | ICD-10-CM | POA: Diagnosis not present

## 2018-11-21 DIAGNOSIS — N870 Mild cervical dysplasia: Secondary | ICD-10-CM | POA: Diagnosis present

## 2018-11-21 DIAGNOSIS — F419 Anxiety disorder, unspecified: Secondary | ICD-10-CM | POA: Diagnosis not present

## 2018-11-21 DIAGNOSIS — Z86010 Personal history of colonic polyps: Secondary | ICD-10-CM | POA: Insufficient documentation

## 2018-11-21 DIAGNOSIS — Z885 Allergy status to narcotic agent status: Secondary | ICD-10-CM | POA: Insufficient documentation

## 2018-11-21 DIAGNOSIS — K589 Irritable bowel syndrome without diarrhea: Secondary | ICD-10-CM | POA: Diagnosis not present

## 2018-11-21 DIAGNOSIS — Z86718 Personal history of other venous thrombosis and embolism: Secondary | ICD-10-CM | POA: Insufficient documentation

## 2018-11-21 DIAGNOSIS — G43909 Migraine, unspecified, not intractable, without status migrainosus: Secondary | ICD-10-CM | POA: Diagnosis not present

## 2018-11-21 DIAGNOSIS — Z79899 Other long term (current) drug therapy: Secondary | ICD-10-CM | POA: Diagnosis not present

## 2018-11-21 DIAGNOSIS — F329 Major depressive disorder, single episode, unspecified: Secondary | ICD-10-CM | POA: Diagnosis not present

## 2018-11-21 DIAGNOSIS — Z9049 Acquired absence of other specified parts of digestive tract: Secondary | ICD-10-CM | POA: Insufficient documentation

## 2018-11-21 DIAGNOSIS — Z87891 Personal history of nicotine dependence: Secondary | ICD-10-CM | POA: Diagnosis not present

## 2018-11-21 DIAGNOSIS — M1990 Unspecified osteoarthritis, unspecified site: Secondary | ICD-10-CM | POA: Insufficient documentation

## 2018-11-21 HISTORY — PX: REPAIR VAGINAL CUFF: SHX6067

## 2018-11-21 HISTORY — PX: LEEP: SHX91

## 2018-11-21 LAB — CBC
HCT: 40.3 % (ref 36.0–46.0)
Hemoglobin: 13.8 g/dL (ref 12.0–15.0)
MCH: 31.5 pg (ref 26.0–34.0)
MCHC: 34.2 g/dL (ref 30.0–36.0)
MCV: 92 fL (ref 80.0–100.0)
Platelets: 255 10*3/uL (ref 150–400)
RBC: 4.38 MIL/uL (ref 3.87–5.11)
RDW: 11.7 % (ref 11.5–15.5)
WBC: 5 10*3/uL (ref 4.0–10.5)
nRBC: 0 % (ref 0.0–0.2)

## 2018-11-21 LAB — TYPE AND SCREEN
ABO/RH(D): B POS
Antibody Screen: NEGATIVE

## 2018-11-21 LAB — BASIC METABOLIC PANEL
Anion gap: 8 (ref 5–15)
BUN: 15 mg/dL (ref 6–20)
CALCIUM: 9.4 mg/dL (ref 8.9–10.3)
CO2: 28 mmol/L (ref 22–32)
CREATININE: 1 mg/dL (ref 0.44–1.00)
Chloride: 102 mmol/L (ref 98–111)
GFR calc Af Amer: >60 mL/min (ref >60)
GLUCOSE: 108 mg/dL — AB (ref 70–99)
Potassium: 4.1 mmol/L (ref 3.5–5.1)
SODIUM: 138 mmol/L (ref 135–145)

## 2018-11-21 SURGERY — LEEP (LOOP ELECTROSURGICAL EXCISION PROCEDURE)
Anesthesia: General

## 2018-11-21 MED ORDER — ROCURONIUM BROMIDE 50 MG/5ML IV SOLN
INTRAVENOUS | Status: AC
  Filled 2018-11-21: qty 1

## 2018-11-21 MED ORDER — FENTANYL CITRATE (PF) 100 MCG/2ML IJ SOLN
INTRAMUSCULAR | Status: DC | PRN
  Administered 2018-11-21: 100 ug via INTRAVENOUS

## 2018-11-21 MED ORDER — ONDANSETRON HCL 4 MG/2ML IJ SOLN
INTRAMUSCULAR | Status: AC
  Administered 2018-11-21: 4 mg via INTRAVENOUS
  Filled 2018-11-21: qty 2

## 2018-11-21 MED ORDER — SUGAMMADEX SODIUM 200 MG/2ML IV SOLN
INTRAVENOUS | Status: DC | PRN
  Administered 2018-11-21: 200 mg via INTRAVENOUS

## 2018-11-21 MED ORDER — PROPOFOL 500 MG/50ML IV EMUL
INTRAVENOUS | Status: DC | PRN
  Administered 2018-11-21: 25 ug/kg/min via INTRAVENOUS

## 2018-11-21 MED ORDER — PROPOFOL 500 MG/50ML IV EMUL
INTRAVENOUS | Status: AC
  Filled 2018-11-21: qty 50

## 2018-11-21 MED ORDER — HYDROCODONE-ACETAMINOPHEN 5-325 MG PO TABS
1.0000 | ORAL_TABLET | Freq: Once | ORAL | Status: AC
  Administered 2018-11-21: 2 via ORAL

## 2018-11-21 MED ORDER — FERRIC SUBSULFATE 259 MG/GM EX SOLN
CUTANEOUS | Status: AC
  Filled 2018-11-21: qty 8

## 2018-11-21 MED ORDER — LIDOCAINE HCL (CARDIAC) PF 100 MG/5ML IV SOSY
PREFILLED_SYRINGE | INTRAVENOUS | Status: DC | PRN
  Administered 2018-11-21: 100 mg via INTRAVENOUS

## 2018-11-21 MED ORDER — SUCCINYLCHOLINE CHLORIDE 20 MG/ML IJ SOLN
INTRAMUSCULAR | Status: AC
  Filled 2018-11-21: qty 1

## 2018-11-21 MED ORDER — FENTANYL CITRATE (PF) 100 MCG/2ML IJ SOLN
25.0000 ug | INTRAMUSCULAR | Status: DC | PRN
  Administered 2018-11-21 (×3): 25 ug via INTRAVENOUS

## 2018-11-21 MED ORDER — ROCURONIUM BROMIDE 100 MG/10ML IV SOLN
INTRAVENOUS | Status: DC | PRN
  Administered 2018-11-21: 30 mg via INTRAVENOUS

## 2018-11-21 MED ORDER — FENTANYL CITRATE (PF) 100 MCG/2ML IJ SOLN
INTRAMUSCULAR | Status: AC
  Filled 2018-11-21: qty 2

## 2018-11-21 MED ORDER — SUGAMMADEX SODIUM 200 MG/2ML IV SOLN
INTRAVENOUS | Status: AC
  Filled 2018-11-21: qty 2

## 2018-11-21 MED ORDER — FENTANYL CITRATE (PF) 100 MCG/2ML IJ SOLN
INTRAMUSCULAR | Status: AC
  Administered 2018-11-21: 25 ug via INTRAVENOUS
  Filled 2018-11-21: qty 2

## 2018-11-21 MED ORDER — ONDANSETRON HCL 4 MG/2ML IJ SOLN
4.0000 mg | Freq: Once | INTRAMUSCULAR | Status: AC | PRN
  Administered 2018-11-21: 4 mg via INTRAVENOUS

## 2018-11-21 MED ORDER — HYDROCODONE-ACETAMINOPHEN 5-325 MG PO TABS
ORAL_TABLET | ORAL | Status: AC
  Administered 2018-11-21: 2 via ORAL
  Filled 2018-11-21: qty 2

## 2018-11-21 MED ORDER — LIDOCAINE HCL (PF) 2 % IJ SOLN
INTRAMUSCULAR | Status: AC
  Filled 2018-11-21: qty 10

## 2018-11-21 MED ORDER — PROPOFOL 10 MG/ML IV BOLUS
INTRAVENOUS | Status: DC | PRN
  Administered 2018-11-21: 150 mg via INTRAVENOUS

## 2018-11-21 MED ORDER — DEXAMETHASONE SODIUM PHOSPHATE 10 MG/ML IJ SOLN
INTRAMUSCULAR | Status: AC
  Filled 2018-11-21: qty 1

## 2018-11-21 MED ORDER — LACTATED RINGERS IV SOLN
INTRAVENOUS | Status: DC
  Administered 2018-11-21: 10:00:00 via INTRAVENOUS

## 2018-11-21 MED ORDER — LIDOCAINE-EPINEPHRINE 1 %-1:100000 IJ SOLN
INTRAMUSCULAR | Status: AC
  Filled 2018-11-21: qty 1

## 2018-11-21 MED ORDER — FAMOTIDINE 20 MG PO TABS
20.0000 mg | ORAL_TABLET | Freq: Once | ORAL | Status: AC
  Administered 2018-11-21: 20 mg via ORAL

## 2018-11-21 MED ORDER — MIDAZOLAM HCL 2 MG/2ML IJ SOLN
INTRAMUSCULAR | Status: DC | PRN
  Administered 2018-11-21: 2 mg via INTRAVENOUS

## 2018-11-21 MED ORDER — ONDANSETRON HCL 4 MG/2ML IJ SOLN
INTRAMUSCULAR | Status: AC
  Filled 2018-11-21: qty 2

## 2018-11-21 MED ORDER — MIDAZOLAM HCL 2 MG/2ML IJ SOLN
INTRAMUSCULAR | Status: AC
  Filled 2018-11-21: qty 2

## 2018-11-21 MED ORDER — PHENYLEPHRINE HCL 10 MG/ML IJ SOLN
INTRAMUSCULAR | Status: DC | PRN
  Administered 2018-11-21 (×2): 100 ug via INTRAVENOUS

## 2018-11-21 MED ORDER — PROPOFOL 10 MG/ML IV BOLUS
INTRAVENOUS | Status: AC
  Filled 2018-11-21: qty 20

## 2018-11-21 MED ORDER — SUCCINYLCHOLINE CHLORIDE 20 MG/ML IJ SOLN
INTRAMUSCULAR | Status: DC | PRN
  Administered 2018-11-21: 100 mg via INTRAVENOUS

## 2018-11-21 MED ORDER — DEXAMETHASONE SODIUM PHOSPHATE 10 MG/ML IJ SOLN
INTRAMUSCULAR | Status: DC | PRN
  Administered 2018-11-21: 10 mg via INTRAVENOUS

## 2018-11-21 MED ORDER — ONDANSETRON HCL 4 MG/2ML IJ SOLN
INTRAMUSCULAR | Status: DC | PRN
  Administered 2018-11-21: 4 mg via INTRAVENOUS

## 2018-11-21 MED ORDER — FAMOTIDINE 20 MG PO TABS
ORAL_TABLET | ORAL | Status: AC
  Administered 2018-11-21: 20 mg via ORAL
  Filled 2018-11-21: qty 1

## 2018-11-21 MED ORDER — PHENYLEPHRINE HCL 10 MG/ML IJ SOLN
INTRAMUSCULAR | Status: AC
  Filled 2018-11-21: qty 1

## 2018-11-21 SURGICAL SUPPLY — 47 items
APPLICATOR COTTON TIP 6IN STRL (MISCELLANEOUS) ×3 IMPLANT
BAG URINE DRAINAGE (UROLOGICAL SUPPLIES) ×3 IMPLANT
CANISTER SUCT 1200ML W/VALVE (MISCELLANEOUS) ×3 IMPLANT
CATH FOLEY 2WAY  5CC 16FR (CATHETERS) ×2
CATH ROBINSON RED A/P 16FR (CATHETERS) ×3 IMPLANT
CATH URTH 16FR FL 2W BLN LF (CATHETERS) ×1 IMPLANT
CNTNR SPEC 2.5X3XGRAD LEK (MISCELLANEOUS) ×1
CONT SPEC 4OZ STER OR WHT (MISCELLANEOUS) ×2
CONTAINER SPEC 2.5X3XGRAD LEK (MISCELLANEOUS) ×1 IMPLANT
COVER WAND RF STERILE (DRAPES) ×3 IMPLANT
DRAPE PERI LITHO V/GYN (MISCELLANEOUS) ×3 IMPLANT
DRAPE SURG 17X11 SM STRL (DRAPES) ×3 IMPLANT
DRAPE UNDER BUTTOCK W/FLU (DRAPES) ×3 IMPLANT
ELECT LEEP 2.0X0.8 R2008 (MISCELLANEOUS) ×3
ELECT LEEP BALL 5MM 12CM (MISCELLANEOUS) ×3
ELECT LOOP 1.0X1.0CM R1010 (MISCELLANEOUS) ×3
ELECT REM PT RETURN 9FT ADLT (ELECTROSURGICAL) ×3
ELECTRODE LEEP 2.0X0.8 R2008 (MISCELLANEOUS) ×1 IMPLANT
ELECTRODE LEEP BALL 5MM 12CM (MISCELLANEOUS) ×1 IMPLANT
ELECTRODE LOOP 1.0X1.0CM R1010 (MISCELLANEOUS) ×1 IMPLANT
ELECTRODE REM PT RTRN 9FT ADLT (ELECTROSURGICAL) ×1 IMPLANT
GLOVE BIO SURGEON STRL SZ7 (GLOVE) ×3 IMPLANT
GLOVE INDICATOR 7.5 STRL GRN (GLOVE) ×3 IMPLANT
GOWN STRL REUS W/ TWL LRG LVL3 (GOWN DISPOSABLE) ×3 IMPLANT
GOWN STRL REUS W/ TWL XL LVL3 (GOWN DISPOSABLE) ×1 IMPLANT
GOWN STRL REUS W/TWL LRG LVL3 (GOWN DISPOSABLE) ×6
GOWN STRL REUS W/TWL XL LVL3 (GOWN DISPOSABLE) ×2
KIT TURNOVER CYSTO (KITS) ×3 IMPLANT
LABEL OR SOLS (LABEL) ×3 IMPLANT
NEEDLE HYPO 22GX1.5 SAFETY (NEEDLE) ×3 IMPLANT
NEEDLE SPNL 22GX3.5 QUINCKE BK (NEEDLE) ×3 IMPLANT
PACK BASIN MINOR ARMC (MISCELLANEOUS) ×3 IMPLANT
PACK DNC HYST (MISCELLANEOUS) ×3 IMPLANT
PAD OB MATERNITY 4.3X12.25 (PERSONAL CARE ITEMS) ×3 IMPLANT
PAD PREP 24X41 OB/GYN DISP (PERSONAL CARE ITEMS) ×3 IMPLANT
PENCIL ELECTRO HAND CTR (MISCELLANEOUS) ×3 IMPLANT
STRAW SMOKE EVAC LEEP 6150 NON (MISCELLANEOUS) ×3 IMPLANT
SUT PDS 2-0 27IN (SUTURE) IMPLANT
SUT VIC AB 0 CT1 27 (SUTURE) ×2
SUT VIC AB 0 CT1 27XCR 8 STRN (SUTURE) ×1 IMPLANT
SUT VIC AB 0 CT1 36 (SUTURE) IMPLANT
SUT VIC AB 2-0 SH 27 (SUTURE)
SUT VIC AB 2-0 SH 27XBRD (SUTURE) IMPLANT
SYR 10ML LL (SYRINGE) ×3 IMPLANT
SYR CONTROL 10ML (SYRINGE) ×3 IMPLANT
TOWEL OR 17X26 4PK STRL BLUE (TOWEL DISPOSABLE) ×3 IMPLANT
WATER STERILE IRR 1000ML POUR (IV SOLUTION) ×3 IMPLANT

## 2018-11-21 NOTE — Anesthesia Postprocedure Evaluation (Signed)
Anesthesia Post Note  Patient: Krista Allen  Procedure(s) Performed: LOOP ELECTROSURGICAL EXCISION PROCEDURE (LEEP) (N/A ) REPAIR VAGINAL CUFF, revision of vaginal cuff scar (N/A )  Patient location during evaluation: PACU Anesthesia Type: General Level of consciousness: awake and alert Pain management: pain level controlled Vital Signs Assessment: post-procedure vital signs reviewed and stable Respiratory status: spontaneous breathing and respiratory function stable Cardiovascular status: stable Anesthetic complications: no     Last Vitals:  Vitals:   11/21/18 1321 11/21/18 1326  BP: 136/83 131/86  Pulse: 73 71  Resp:    Temp:    SpO2: 99% 98%    Last Pain:  Vitals:   11/21/18 1326  TempSrc:   PainSc: 4                  Dalilah Curlin K

## 2018-11-21 NOTE — Op Note (Signed)
Carolynn Commentlizabeth J Steely PROCEDURE DATE: 11/21/2018   PREOPERATIVE DIAGNOSIS: Vaginal cuff granulation tissue POSTOPERATIVE DIAGNOSIS: The same PROCEDURE:  - Exam under anesthesia - Vaginal cuff revision - Cautery excision of granulation tissue  SURGEON:  Dr. Cline CoolsBethany E. Drew Herman  ASSISTANT: CST ANESTHESIOLOGIST:  Anesthesiologist: Naomie DeanKephart, William K, MD CRNA: Irving BurtonBachich, Jennifer, CRNA; Riccardo DubinMills, Cecelia W, CRNA  INDICATIONS: 52 y.o.  with aforementioned preoperative diagnoses here today for surgical management.   Risks of surgery were discussed with the patient including but not limited to: bleeding which may require transfusion or reoperation; infection which may require antibiotics; injury to bowel, bladder, ureters or other surrounding organs; need for additional procedures including laparotomy; thromboembolic phenomenon, incisional problems and other postoperative/anesthesia complications. Written informed consent was obtained.    FINDINGS:  Intact vaginal cuff with bright red granulation tissue extruding in a frond pattern from the cuff. No evidence of vaginal fistula or peritoneal disruption with no cuff dehiscence noted.  ANESTHESIA:    General INTRAVENOUS FLUIDS: 500 ml ESTIMATED BLOOD LOSS: minimal SPECIMENS: Granulation tissue and cautery of granulation tissue COMPLICATIONS: None immediate  PROCEDURE IN DETAIL:  The patient was brought to the OR where she had sequential compression devices applied to her lower extremities while in the preoperative area.  General anesthesia was administered and was found to be adequate.  She was placed in the dorsal lithotomy position, and was prepped and draped in a sterile manner.  A Foley catheter was inserted into her bladder an estimated amount of clear urine was drained.  After an adequate timeout was performed, an exam was undertaken that showed the above findings. A weighted speculum was placed in the vagina, and excision of the granulation tissue was  performed. A electrified loop was used to remove the base and a stich placed left lateral for hemostasis. Very minimal cautery was used to assure hemostasis.   The patient tolerated the procedures well.  All instruments, needles, and sponge counts were correct x 2. The patient was taken to the recovery room awake, extubated and in stable condition.

## 2018-11-21 NOTE — Transfer of Care (Signed)
Immediate Anesthesia Transfer of Care Note  Patient: Krista Allen  Procedure(s) Performed: LOOP ELECTROSURGICAL EXCISION PROCEDURE (LEEP) (N/A ) REPAIR VAGINAL CUFF, revision of vaginal cuff scar (N/A )  Patient Location: PACU  Anesthesia Type:General  Level of Consciousness: awake, alert  and oriented  Airway & Oxygen Therapy: Patient Spontanous Breathing and Patient connected to face mask oxygen  Post-op Assessment: Report given to RN and Post -op Vital signs reviewed and stable  Post vital signs: Reviewed and stable  Last Vitals:  Vitals Value Taken Time  BP 127/92 11/21/2018 12:25 PM  Temp 36.2 C 11/21/2018 12:25 PM  Pulse 72 11/21/2018 12:29 PM  Resp 17 11/21/2018 12:29 PM  SpO2 100 % 11/21/2018 12:29 PM  Vitals shown include unvalidated device data.  Last Pain:  Vitals:   11/21/18 0938  TempSrc: Oral  PainSc: 0-No pain         Complications: No apparent anesthesia complications

## 2018-11-21 NOTE — OR Nursing (Signed)
Discussed discharge instructions with pt and husband. Both voice understanding. 

## 2018-11-21 NOTE — Anesthesia Preprocedure Evaluation (Addendum)
Anesthesia Evaluation  Patient identified by MRN, date of birth, ID band Patient awake    Reviewed: Allergy & Precautions, NPO status , Patient's Chart, lab work & pertinent test results  History of Anesthesia Complications (+) Family history of anesthesia reactionNegative for: history of anesthetic complications  Airway Mallampati: III     Mouth opening: Limited Mouth Opening  Dental   Pulmonary neg COPD, former smoker,           Cardiovascular (-) hypertension(-) Past MI and (-) CHF (-) dysrhythmias (-) Valvular Problems/Murmurs     Neuro/Psych neg Seizures Anxiety Depression    GI/Hepatic neg GERD  ,  Endo/Other  neg diabetes  Renal/GU negative Renal ROS     Musculoskeletal   Abdominal   Peds  Hematology   Anesthesia Other Findings   Reproductive/Obstetrics                            Anesthesia Physical Anesthesia Plan  ASA: II  Anesthesia Plan: General   Post-op Pain Management:    Induction:   PONV Risk Score and Plan: 3 and Dexamethasone, Ondansetron and Midazolam  Airway Management Planned: LMA  Additional Equipment:   Intra-op Plan:   Post-operative Plan:   Informed Consent: I have reviewed the patients History and Physical, chart, labs and discussed the procedure including the risks, benefits and alternatives for the proposed anesthesia with the patient or authorized representative who has indicated his/her understanding and acceptance.     Plan Discussed with:   Anesthesia Plan Comments:         Anesthesia Quick Evaluation

## 2018-11-21 NOTE — Interval H&P Note (Signed)
History and Physical Interval Note:  11/21/2018 11:04 AM  Krista Allen  has presented today for surgery, with the diagnosis of cuff lesion  The various methods of treatment have been discussed with the patient and family. After consideration of risks, benefits and other options for treatment, the patient has consented to  Procedure(s): LOOP ELECTROSURGICAL EXCISION PROCEDURE (LEEP) (N/A) REPAIR VAGINAL CUFF, revision of vaginal cuff scar (N/A) as a surgical intervention .  The patient's history has been reviewed, patient examined, no change in status, stable for surgery.  I have reviewed the patient's chart and labs.  Questions were answered to the patient's satisfaction.     Christeen DouglasBethany Ryott Rafferty

## 2018-11-21 NOTE — Anesthesia Procedure Notes (Signed)
Procedure Name: Intubation Date/Time: 11/21/2018 11:37 AM Performed by: Rudean Hitt, CRNA Pre-anesthesia Checklist: Patient identified, Patient being monitored, Timeout performed, Emergency Drugs available and Suction available Patient Re-evaluated:Patient Re-evaluated prior to induction Oxygen Delivery Method: Circle system utilized Preoxygenation: Pre-oxygenation with 100% oxygen Induction Type: IV induction Ventilation: Mask ventilation without difficulty Laryngoscope Size: Mac and 3 Grade View: Grade II Tube type: Oral Tube size: 7.0 mm Number of attempts: 1 Airway Equipment and Method: Stylet Placement Confirmation: ETT inserted through vocal cords under direct vision,  positive ETCO2 and breath sounds checked- equal and bilateral Secured at: 21 cm Tube secured with: Tape Dental Injury: Teeth and Oropharynx as per pre-operative assessment

## 2018-11-21 NOTE — Discharge Instructions (Signed)
Discharge instructions after vaginal cuff suturing  Signs and Symptoms to Report  Call our office at [phone]) [phone] if you have any of the following:    Fever over 100.4 degrees or higher  Severe stomach pain not relieved with pain medications  Bright red bleeding thats heavier than a period that does not slow with rest after the first 24 hours  To go the bathroom a lot (frequency), you cant hold your urine (urgency), or it hurts when you empty your bladder (urinate)  Chest pain  Shortness of breath  Pain in the calves of your legs  Severe nausea and vomiting not relieved with anti-nausea medications  Any concerns  What You Can Expect after Surgery  You may see some pink tinged, bloody fluid. This is normal. You may also have cramping for several days.   Activities after Your Discharge Follow these guidelines to help speed your recovery at home:  Dont drive if you are in pain or taking narcotic pain medicine. You may drive when you can safely slam on the brakes, turn the wheel forcefully, and rotate your torso comfortably. This is typically 4-7 days. Practice in a parking lot or side street prior to attempting to drive regularly.   Ask others to help with household chores for 4 weeks.  Dont do strenuous activities, exercises, or sports like vacuuming, tennis, squash, etc. until your doctor says it is safe to do so.  Walk as you feel able. Rest often since it may take a week or two for your energy level to return to normal.   You may climb stairs  Avoid constipation:   -Eat fruits, vegetables, and whole grains. Eat small meals as your appetite will take time to return to normal.   -Drink 6 to 8 glasses of water each day unless your doctor has told you to limit your fluids.   -Use a laxative or stool softener as needed if constipation becomes a problem. You may take Miralax, metamucil, Citrucil, Colace, Senekot, FiberCon, etc. If this does not relieve the constipation,  try two tablespoons of Milk Of Magnesia every 8 hours until your bowels move.   You may shower.   Do not get in a hot tub, swimming pool, etc. until your doctor agrees.  Do not douche, use tampons, or have sex until your doctor says it is okay, usually about 2 weeks. Return to the office in 1 week  Take your pain medicine when you need it. The medicine may not work as well if the pain is bad.  Take the medicines you were taking before surgery. Other medications you might need are pain medications (ibuprofen), medications for constipation (Colace).

## 2018-11-21 NOTE — Anesthesia Post-op Follow-up Note (Signed)
Anesthesia QCDR form completed.        

## 2018-11-23 LAB — SURGICAL PATHOLOGY

## 2018-12-08 ENCOUNTER — Other Ambulatory Visit: Payer: Self-pay | Admitting: Neurology

## 2018-12-08 DIAGNOSIS — R200 Anesthesia of skin: Secondary | ICD-10-CM

## 2018-12-13 IMAGING — CR DG CHEST 2V
1 series · 2 of 2 positions shown · non-contrast
Comparison: CT chest 03/01/2009, 08/24/2008, 05/11/2008

CLINICAL DATA: Preoperative evaluation for RIGHT knee surgery on
[REDACTED]

EXAM:
CHEST  2 VIEW

[Series 1: w chest pa · 0.14mm/px · 2 of 2 slices shown]
[im 1/2]
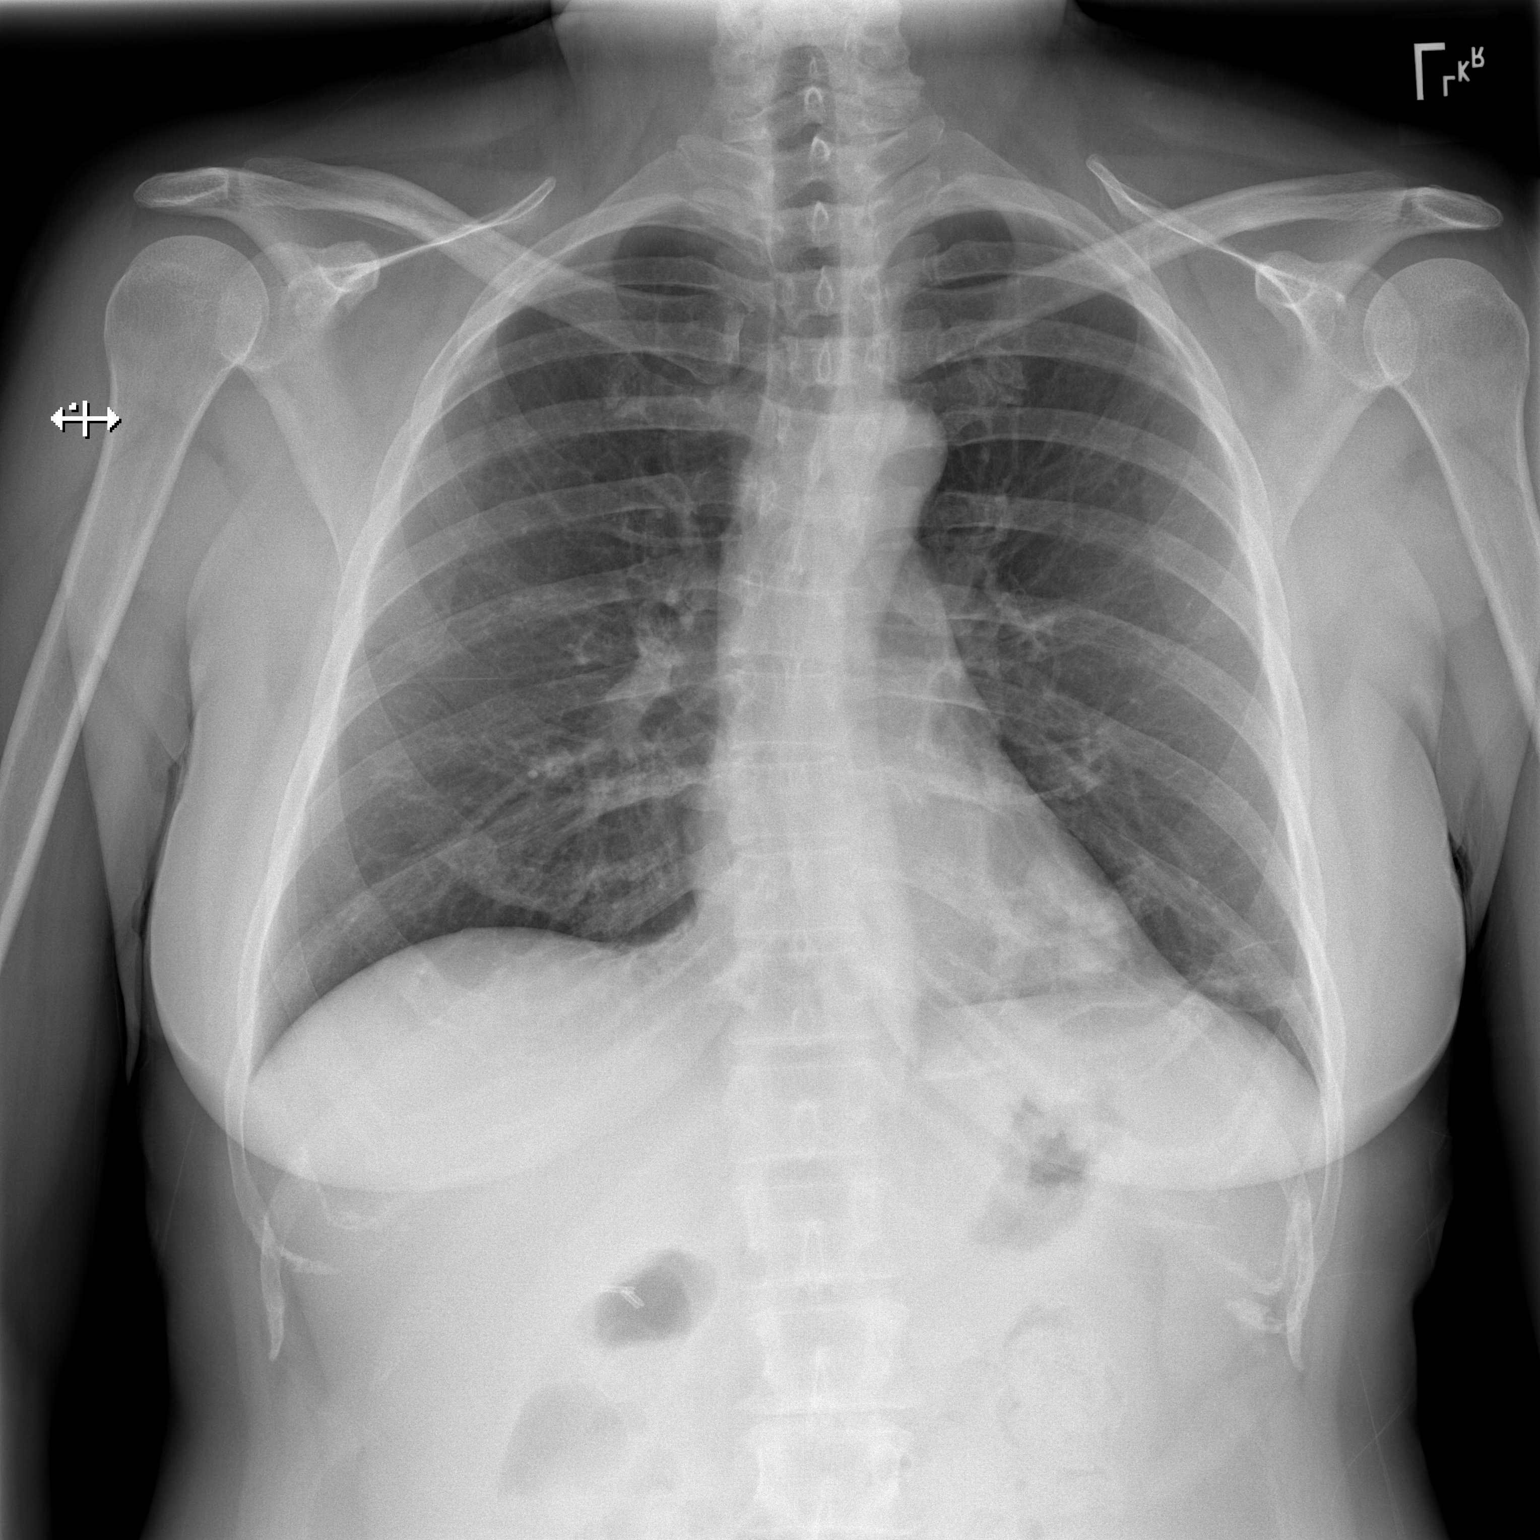
[im 2/2]
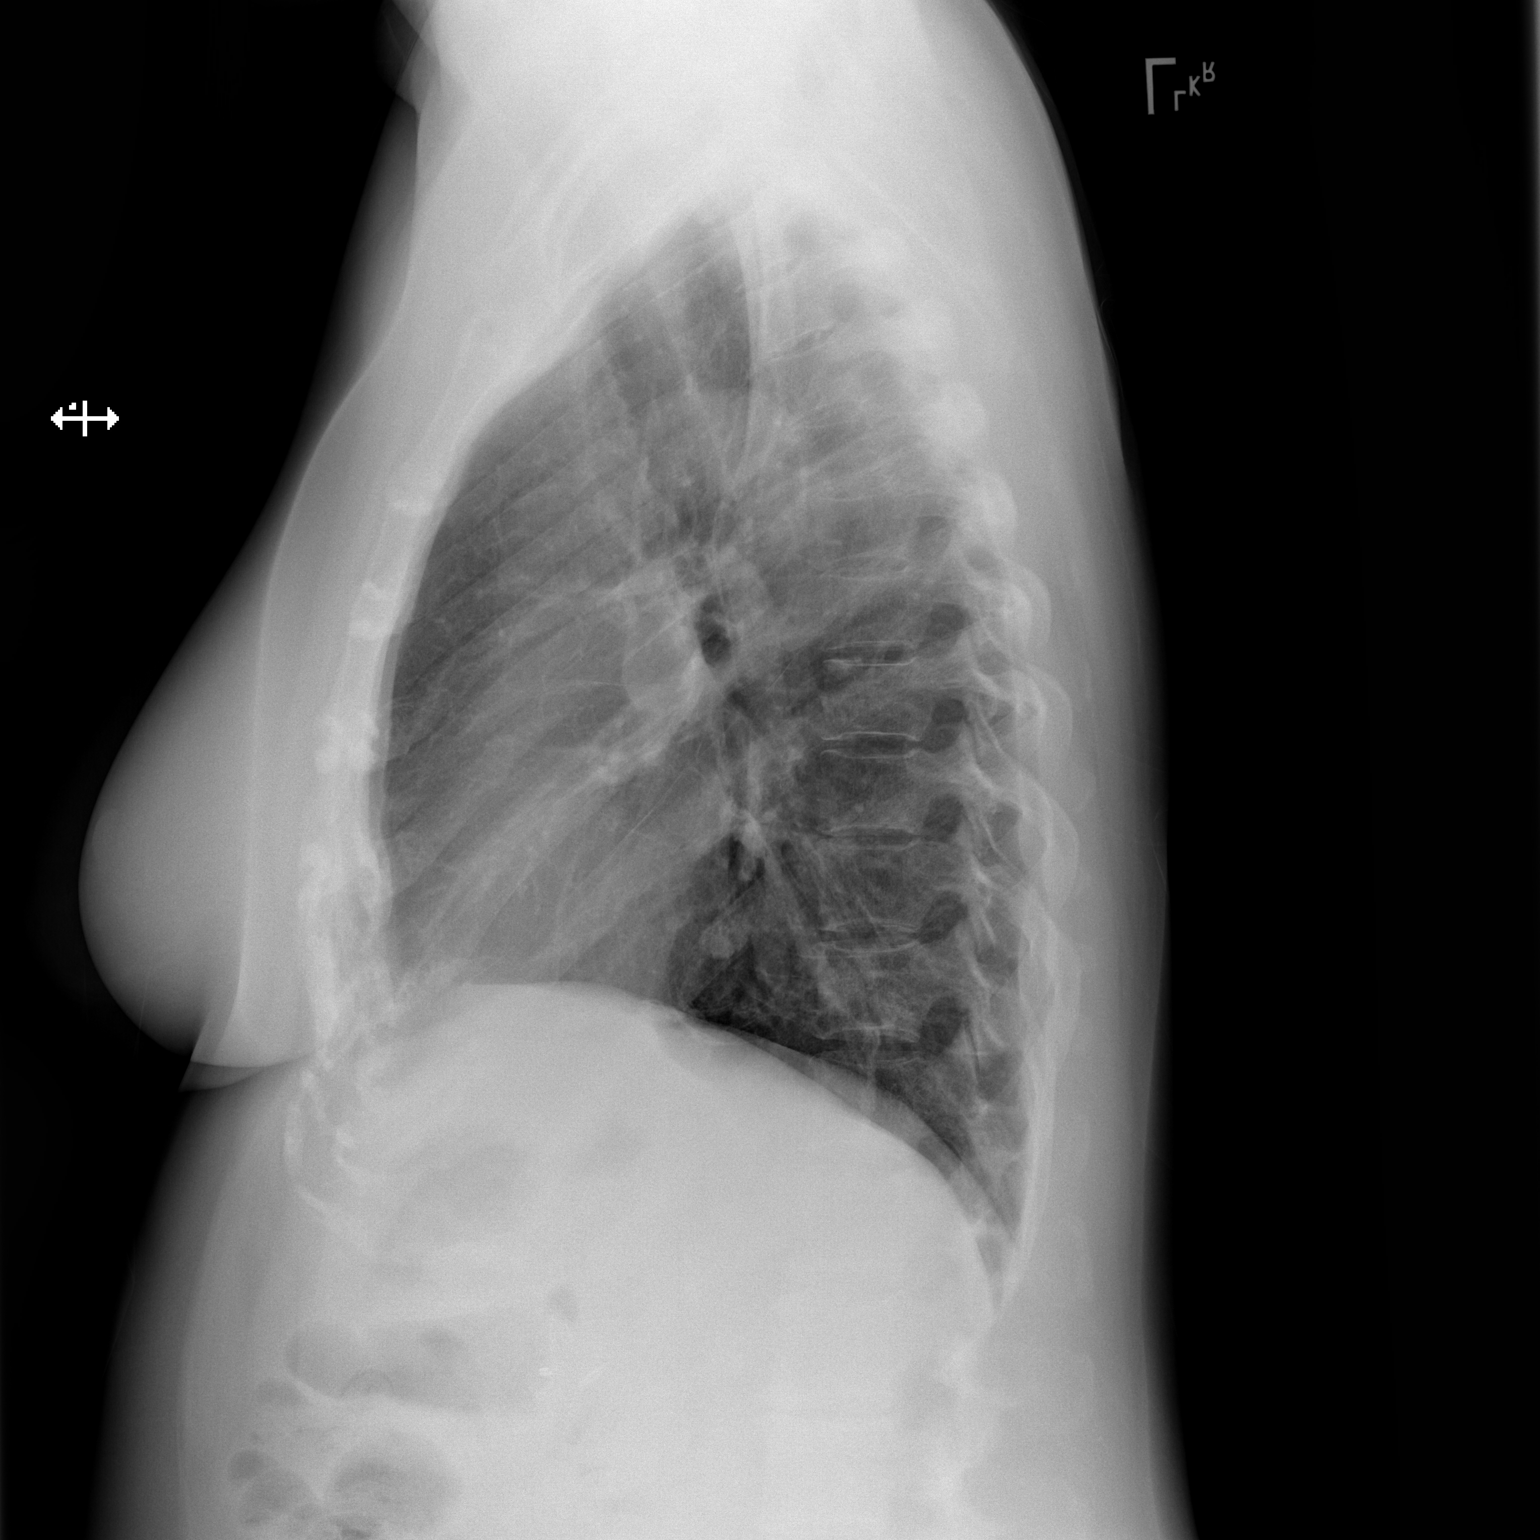

[2 of 2 positions shown; findings below may reference images not displayed]

FINDINGS: Normal heart size, mediastinal contours, and pulmonary vascularity.

Subsegmental atelectasis at LEFT base.

Questionable nodular foci at LEFT base measuring 19 mm and 15 mm in
sizes; these are seen on the PA view but not well localized on
lateral view.

Remaining lungs clear.

No pleural effusion or pneumothorax.

Minimal thoracic scoliosis.
IMPRESSION: Questionable nodular foci at LEFT lung base measuring 19 mm and 15
mm in size.

These correspond to noncalcified 19 mm and 12 mm diameter smoothly
marginated noncalcified nodules in the LEFT lower lobe on the prior
CT, the larger lesion stable, the smaller lesion minimally increased
in size.

The lack of significant interval change in size and appearance since
1558 makes these likely benign in etiology.

## 2018-12-16 ENCOUNTER — Ambulatory Visit
Admission: RE | Admit: 2018-12-16 | Discharge: 2018-12-16 | Disposition: A | Discharge status: Home / Self Care | Payer: Managed Care, Other (non HMO) | Source: Ambulatory Visit | Attending: Neurology | Admitting: Neurology

## 2018-12-16 DIAGNOSIS — G9389 Other specified disorders of brain: Secondary | ICD-10-CM | POA: Diagnosis not present

## 2018-12-16 DIAGNOSIS — R200 Anesthesia of skin: Secondary | ICD-10-CM | POA: Diagnosis not present

## 2018-12-16 MED ORDER — GADOBUTROL 1 MMOL/ML IV SOLN
6.0000 mL | Freq: Once | INTRAVENOUS | Status: AC | PRN
  Administered 2018-12-16: 6 mL via INTRAVENOUS

## 2018-12-23 ENCOUNTER — Ambulatory Visit: Payer: 59

## 2018-12-26 ENCOUNTER — Other Ambulatory Visit: Payer: Self-pay | Admitting: Neurology

## 2018-12-26 DIAGNOSIS — G379 Demyelinating disease of central nervous system, unspecified: Secondary | ICD-10-CM

## 2018-12-30 ENCOUNTER — Ambulatory Visit
Admission: RE | Admit: 2018-12-30 | Discharge: 2018-12-30 | Disposition: A | Discharge status: Home / Self Care | Payer: Managed Care, Other (non HMO) | Source: Ambulatory Visit | Attending: Neurology | Admitting: Neurology

## 2018-12-30 DIAGNOSIS — G379 Demyelinating disease of central nervous system, unspecified: Secondary | ICD-10-CM | POA: Diagnosis present

## 2018-12-30 MED ORDER — GADOBUTROL 1 MMOL/ML IV SOLN
6.0000 mL | Freq: Once | INTRAVENOUS | Status: AC | PRN
  Administered 2018-12-30: 6 mL via INTRAVENOUS

## 2019-01-04 ENCOUNTER — Other Ambulatory Visit: Payer: Self-pay | Admitting: Internal Medicine

## 2019-01-04 DIAGNOSIS — Z1231 Encounter for screening mammogram for malignant neoplasm of breast: Secondary | ICD-10-CM

## 2019-01-07 ENCOUNTER — Ambulatory Visit: Payer: Managed Care, Other (non HMO)

## 2019-01-16 ENCOUNTER — Encounter (INDEPENDENT_AMBULATORY_CARE_PROVIDER_SITE_OTHER): Payer: Self-pay

## 2019-01-16 ENCOUNTER — Ambulatory Visit
Admission: RE | Admit: 2019-01-16 | Discharge: 2019-01-16 | Disposition: A | Discharge status: Home / Self Care | Payer: Managed Care, Other (non HMO) | Source: Ambulatory Visit | Attending: Internal Medicine | Admitting: Internal Medicine

## 2019-01-16 DIAGNOSIS — Z1231 Encounter for screening mammogram for malignant neoplasm of breast: Secondary | ICD-10-CM | POA: Diagnosis not present

## 2019-06-27 ENCOUNTER — Other Ambulatory Visit (HOSPITAL_COMMUNITY): Payer: Self-pay | Admitting: Physical Medicine and Rehabilitation

## 2019-06-27 DIAGNOSIS — M5416 Radiculopathy, lumbar region: Secondary | ICD-10-CM

## 2019-07-14 ENCOUNTER — Other Ambulatory Visit: Payer: Self-pay

## 2019-07-14 ENCOUNTER — Ambulatory Visit
Admission: RE | Admit: 2019-07-14 | Discharge: 2019-07-14 | Disposition: A | Discharge status: Home / Self Care | Payer: Managed Care, Other (non HMO) | Source: Ambulatory Visit | Attending: Physical Medicine and Rehabilitation | Admitting: Physical Medicine and Rehabilitation

## 2019-07-14 DIAGNOSIS — M5416 Radiculopathy, lumbar region: Secondary | ICD-10-CM | POA: Insufficient documentation

## 2019-08-17 ENCOUNTER — Other Ambulatory Visit: Payer: Self-pay | Admitting: Physical Medicine and Rehabilitation

## 2019-08-17 DIAGNOSIS — M25551 Pain in right hip: Secondary | ICD-10-CM

## 2019-08-18 ENCOUNTER — Other Ambulatory Visit: Payer: Self-pay

## 2019-08-18 ENCOUNTER — Ambulatory Visit
Admission: RE | Admit: 2019-08-18 | Discharge: 2019-08-18 | Disposition: A | Discharge status: Home / Self Care | Payer: Managed Care, Other (non HMO) | Source: Ambulatory Visit | Attending: Physical Medicine and Rehabilitation | Admitting: Physical Medicine and Rehabilitation

## 2019-08-18 DIAGNOSIS — M25551 Pain in right hip: Secondary | ICD-10-CM | POA: Insufficient documentation

## 2020-04-23 ENCOUNTER — Other Ambulatory Visit: Payer: Self-pay | Admitting: Physician Assistant

## 2020-04-23 DIAGNOSIS — M2392 Unspecified internal derangement of left knee: Secondary | ICD-10-CM

## 2020-04-25 ENCOUNTER — Other Ambulatory Visit: Payer: Self-pay

## 2020-04-25 ENCOUNTER — Ambulatory Visit
Admission: RE | Admit: 2020-04-25 | Discharge: 2020-04-25 | Disposition: A | Discharge status: Home / Self Care | Payer: Managed Care, Other (non HMO) | Source: Ambulatory Visit | Attending: Physician Assistant | Admitting: Physician Assistant

## 2020-04-25 DIAGNOSIS — M2392 Unspecified internal derangement of left knee: Secondary | ICD-10-CM | POA: Diagnosis present

## 2020-04-26 ENCOUNTER — Other Ambulatory Visit: Payer: Self-pay | Admitting: Surgery

## 2020-04-30 ENCOUNTER — Other Ambulatory Visit: Payer: Self-pay

## 2020-04-30 ENCOUNTER — Encounter
Admission: RE | Admit: 2020-04-30 | Discharge: 2020-04-30 | Disposition: A | Discharge status: Home / Self Care | Payer: Managed Care, Other (non HMO) | Source: Ambulatory Visit | Attending: Surgery | Admitting: Surgery

## 2020-04-30 HISTORY — DX: Cerebral infarction, unspecified: I63.9

## 2020-04-30 HISTORY — DX: Depression, unspecified: F32.A

## 2020-04-30 NOTE — Patient Instructions (Signed)
COVID TESTING Date: May 01, 2020 Testing site:  [facility] [address] Thru Hours:  4:09 am - 1:00 pm Once you are tested, you are asked to stay quarantined (avoiding public places) until after your surgery.   Your procedure is scheduled on: May 02, 2020 THURSDAY Report to Day Surgery on the 2nd floor of the [facility]. To find out your arrival time, please call [phone] between 1PM - 3PM on: May 01, 2020  REMEMBER: Instructions that are not followed completely may result in serious medical risk, up to and including death; or upon the discretion of your surgeon and anesthesiologist your surgery may need to be rescheduled.  Do not eat food after midnight the night before surgery.  No gum chewing, lozengers or hard candies.  You may however, drink CLEAR liquids up to 2 hours before you are scheduled to arrive for your surgery. Do not drink anything within 2 hours of your scheduled arrival time.  Clear liquids include: - water  - apple juice without pulp - gatorade (not RED) - black coffee or tea (Do NOT add milk or creamers to the coffee or tea) Do NOT drink anything that is not on this list.  Type 1 and Type 2 diabetics should only drink water.   TAKE THESE MEDICATIONS THE MORNING OF SURGERY WITH A SIP OF WATER: ASPIRIN (DO NOT STOP PER OFFICE) GABAPENTIN EFFEXOR ATORVASTATIN  Stop Anti-inflammatories (NSAIDS) such as Advil, Aleve, Ibuprofen, Motrin, Naproxen, Naprosyn and Aspirin based products such as Excedrin, Goodys Powder, BC Powder. (May take Tylenol or Acetaminophen if needed.)  Stop ANY OVER THE COUNTER supplements until after surgery. (May continue Vitamin D, Vitamin B, and multivitamin.)  No Alcohol for 24 hours before or after surgery.  No Smoking including e-cigarettes for 24 hours prior to surgery.  No chewable tobacco products for at least 6 hours prior to surgery.  No nicotine patches on the day of surgery.  Do not use  any "recreational" drugs for at least a week prior to your surgery.  Please be advised that the combination of cocaine and anesthesia may have negative outcomes, up to and including death. If you test positive for cocaine, your surgery will be cancelled.  On the morning of surgery brush your teeth with toothpaste and water, you may rinse your mouth with mouthwash if you wish. Do not swallow any toothpaste or mouthwash.  Do not wear jewelry, make-up, hairpins, clips or nail polish.  Do not wear lotions, powders, or perfumes.   Do not shave 48 hours prior to surgery.   Contact lenses, hearing aids and dentures may not be worn into surgery.  Do not bring valuables to the hospital. [facility] is not responsible for any missing/lost belongings or valuables.   Use CHG Soap as directed on instructi  Notify your doctor if there is any change in your medical condition (cold, fever, infection).  Wear comfortable clothing (specific to your surgery type) to the hospital.  Plan for stool softeners for home use; pain medications have a tendency to cause constipation. You can also help prevent constipation by eating foods high in fiber such as fruits and vegetables and drinking plenty of fluids as your diet allows.  After surgery, you can help prevent lung complications by doing breathing exercises.  Take deep breaths and cough every 1-2 hours. Your doctor may order a device called an Incentive Spirometer to help you take deep breaths. When coughing or sneezing, hold a pillow  firmly against your incision with both hands. This is called "splinting." Doing this helps protect your incision. It also decreases belly discomfort.  If you are being admitted to the hospital overnight MAY BRING A BAG TO HOSPITAL WITH YOU. After surgery it may be brought to your room.  If you are being discharged the day of surgery, you will not be allowed to drive home. You will need a responsible adult (18 years or older)  to drive you home and stay with you that night.   If you are taking public transportation, you will need to have a responsible adult (18 years or older) with you. Please confirm with your physician that it is acceptable to use public transportation.   Please call the Pre-admissions Testing Dept. at [phone] if you have any questions about these instructions.  Visitation Policy:  Patients undergoing a surgery or procedure may have one family member or support person with them as long as that person is not COVID-19 positive or experiencing its symptoms.  That person may remain in the waiting area during the procedure.  Children under 32 years of age may have both parents or legal guardians with them during their hospital stay.   Inpatient Visitation Update:  Two designated support people may visit a patient during visiting hours 7 am to 8 pm. It must be the same two designated people for the duration of the patient stay. The visitors may come and go during the day, and there is no switching out to have different visitors. A mask must be worn at all times, including in the patient room.

## 2020-05-01 ENCOUNTER — Other Ambulatory Visit
Admission: RE | Admit: 2020-05-01 | Discharge: 2020-05-01 | Disposition: A | Discharge status: Home / Self Care | Payer: Managed Care, Other (non HMO) | Source: Ambulatory Visit | Attending: Surgery | Admitting: Surgery

## 2020-05-01 DIAGNOSIS — Z20822 Contact with and (suspected) exposure to covid-19: Secondary | ICD-10-CM | POA: Insufficient documentation

## 2020-05-01 DIAGNOSIS — Z01818 Encounter for other preprocedural examination: Secondary | ICD-10-CM | POA: Insufficient documentation

## 2020-05-01 LAB — CBC WITH DIFFERENTIAL/PLATELET
Abs Immature Granulocytes: 0.02 10*3/uL (ref 0.00–0.07)
Basophils Absolute: 0 10*3/uL (ref 0.0–0.1)
Basophils Relative: 1 %
Eosinophils Absolute: 0.1 10*3/uL (ref 0.0–0.5)
Eosinophils Relative: 2 %
HCT: 41.2 % (ref 36.0–46.0)
Hemoglobin: 14 g/dL (ref 12.0–15.0)
Immature Granulocytes: 0 %
Lymphocytes Relative: 38 %
Lymphs Abs: 2.4 10*3/uL (ref 0.7–4.0)
MCH: 31.2 pg (ref 26.0–34.0)
MCHC: 34 g/dL (ref 30.0–36.0)
MCV: 91.8 fL (ref 80.0–100.0)
Monocytes Absolute: 0.5 10*3/uL (ref 0.1–1.0)
Monocytes Relative: 9 %
Neutro Abs: 3.1 10*3/uL (ref 1.7–7.7)
Neutrophils Relative %: 50 %
Platelets: 310 10*3/uL (ref 150–400)
RBC: 4.49 MIL/uL (ref 3.87–5.11)
RDW: 12.4 % (ref 11.5–15.5)
WBC: 6.2 10*3/uL (ref 4.0–10.5)
nRBC: 0 % (ref 0.0–0.2)

## 2020-05-01 LAB — COMPREHENSIVE METABOLIC PANEL
ALT: 26 U/L (ref 0–44)
AST: 21 U/L (ref 15–41)
Albumin: 4.5 g/dL (ref 3.5–5.0)
Alkaline Phosphatase: 90 U/L (ref 38–126)
Anion gap: 7 (ref 5–15)
BUN: 20 mg/dL (ref 6–20)
CO2: 29 mmol/L (ref 22–32)
Calcium: 9.3 mg/dL (ref 8.9–10.3)
Chloride: 103 mmol/L (ref 98–111)
Creatinine, Ser: 0.96 mg/dL (ref 0.44–1.00)
GFR calc Af Amer: >60 mL/min (ref >60)
GFR calc non Af Amer: >60 mL/min (ref >60)
Glucose, Bld: 90 mg/dL (ref 70–99)
Potassium: 3.9 mmol/L (ref 3.5–5.1)
Sodium: 139 mmol/L (ref 135–145)
Total Bilirubin: 0.8 mg/dL (ref 0.3–1.2)
Total Protein: 7.5 g/dL (ref 6.5–8.1)

## 2020-05-01 LAB — URINALYSIS, ROUTINE W REFLEX MICROSCOPIC
Bilirubin Urine: NEGATIVE
Glucose, UA: NEGATIVE mg/dL
Hgb urine dipstick: NEGATIVE
Ketones, ur: NEGATIVE mg/dL
Leukocytes,Ua: NEGATIVE
Nitrite: NEGATIVE
Protein, ur: NEGATIVE mg/dL
Specific Gravity, Urine: 1.011 (ref 1.005–1.030)
pH: 5 (ref 5.0–8.0)

## 2020-05-01 LAB — SURGICAL PCR SCREEN
MRSA, PCR: NEGATIVE
Staphylococcus aureus: NEGATIVE

## 2020-05-01 LAB — SARS CORONAVIRUS 2 (TAT 6-24 HRS): SARS Coronavirus 2: NEGATIVE

## 2020-05-02 ENCOUNTER — Observation Stay: Payer: Managed Care, Other (non HMO)

## 2020-05-02 ENCOUNTER — Observation Stay
Admission: RE | Admit: 2020-05-02 | Discharge: 2020-05-03 | Disposition: A | Discharge status: Home / Self Care | Payer: Managed Care, Other (non HMO) | Attending: Surgery | Admitting: Surgery

## 2020-05-02 ENCOUNTER — Ambulatory Visit: Payer: Managed Care, Other (non HMO) | Admitting: Anesthesiology

## 2020-05-02 ENCOUNTER — Encounter
Admission: RE | Disposition: A | Discharge status: Home / Self Care | Payer: Self-pay | Source: Home / Self Care | Attending: Surgery

## 2020-05-02 ENCOUNTER — Other Ambulatory Visit: Payer: Self-pay

## 2020-05-02 ENCOUNTER — Encounter: Payer: Self-pay | Admitting: Surgery

## 2020-05-02 DIAGNOSIS — X58XXXA Exposure to other specified factors, initial encounter: Secondary | ICD-10-CM | POA: Diagnosis not present

## 2020-05-02 DIAGNOSIS — Z87891 Personal history of nicotine dependence: Secondary | ICD-10-CM | POA: Insufficient documentation

## 2020-05-02 DIAGNOSIS — Z86718 Personal history of other venous thrombosis and embolism: Secondary | ICD-10-CM | POA: Insufficient documentation

## 2020-05-02 DIAGNOSIS — M1712 Unilateral primary osteoarthritis, left knee: Secondary | ICD-10-CM | POA: Diagnosis not present

## 2020-05-02 DIAGNOSIS — Z79899 Other long term (current) drug therapy: Secondary | ICD-10-CM | POA: Diagnosis not present

## 2020-05-02 DIAGNOSIS — Z8673 Personal history of transient ischemic attack (TIA), and cerebral infarction without residual deficits: Secondary | ICD-10-CM | POA: Diagnosis not present

## 2020-05-02 DIAGNOSIS — Z8249 Family history of ischemic heart disease and other diseases of the circulatory system: Secondary | ICD-10-CM | POA: Insufficient documentation

## 2020-05-02 DIAGNOSIS — Z96652 Presence of left artificial knee joint: Secondary | ICD-10-CM | POA: Diagnosis present

## 2020-05-02 DIAGNOSIS — K589 Irritable bowel syndrome without diarrhea: Secondary | ICD-10-CM | POA: Diagnosis not present

## 2020-05-02 DIAGNOSIS — Z96651 Presence of right artificial knee joint: Secondary | ICD-10-CM | POA: Insufficient documentation

## 2020-05-02 DIAGNOSIS — S83242A Other tear of medial meniscus, current injury, left knee, initial encounter: Principal | ICD-10-CM | POA: Insufficient documentation

## 2020-05-02 DIAGNOSIS — E538 Deficiency of other specified B group vitamins: Secondary | ICD-10-CM | POA: Insufficient documentation

## 2020-05-02 DIAGNOSIS — Z7982 Long term (current) use of aspirin: Secondary | ICD-10-CM | POA: Insufficient documentation

## 2020-05-02 HISTORY — PX: PARTIAL KNEE ARTHROPLASTY: SHX2174

## 2020-05-02 SURGERY — ARTHROPLASTY, KNEE, UNICOMPARTMENTAL
Anesthesia: Spinal | Site: Knee | Laterality: Left

## 2020-05-02 MED ORDER — OXYCODONE HCL 5 MG/5ML PO SOLN: 5.0000 mg | Freq: Once | ORAL | Status: AC | PRN

## 2020-05-02 MED ORDER — PHENYLEPHRINE HCL (PRESSORS) 10 MG/ML IV SOLN
INTRAVENOUS | Status: DC | PRN
  Administered 2020-05-02 (×8): 100 ug via INTRAVENOUS

## 2020-05-02 MED ORDER — MIDAZOLAM HCL 5 MG/5ML IJ SOLN
INTRAMUSCULAR | Status: DC | PRN
  Administered 2020-05-02: 2 mg via INTRAVENOUS

## 2020-05-02 MED ORDER — BUPIVACAINE LIPOSOME 1.3 % IJ SUSP: INTRAMUSCULAR | Status: DC | PRN

## 2020-05-02 MED ORDER — BUPIVACAINE LIPOSOME 1.3 % IJ SUSP
INTRAMUSCULAR | Status: AC
  Filled 2020-05-02: qty 20

## 2020-05-02 MED ORDER — TRANEXAMIC ACID FOR EPISTAXIS
TOPICAL | Status: DC | PRN
  Administered 2020-05-02: 1000 mg via TOPICAL

## 2020-05-02 MED ORDER — DEXMEDETOMIDINE HCL IN NACL 80 MCG/20ML IV SOLN
INTRAVENOUS | Status: AC
  Filled 2020-05-02: qty 20

## 2020-05-02 MED ORDER — CYANOCOBALAMIN 1000 MCG/ML IJ SOLN: 1000.0000 ug | INTRAMUSCULAR | Status: DC

## 2020-05-02 MED ORDER — EPINEPHRINE PF 1 MG/ML IJ SOLN
INTRAMUSCULAR | Status: AC
  Filled 2020-05-02: qty 1

## 2020-05-02 MED ORDER — BUPIVACAINE HCL (PF) 0.5 % IJ SOLN
INTRAMUSCULAR | Status: AC
  Filled 2020-05-02: qty 10

## 2020-05-02 MED ORDER — OXYCODONE HCL 5 MG PO TABS
5.0000 mg | ORAL_TABLET | ORAL | Status: DC | PRN
  Administered 2020-05-02 – 2020-05-03 (×3): 10 mg via ORAL
  Filled 2020-05-02 (×3): qty 2

## 2020-05-02 MED ORDER — METOCLOPRAMIDE HCL 10 MG PO TABS: 5.0000 mg | ORAL_TABLET | Freq: Three times a day (TID) | ORAL | Status: DC | PRN

## 2020-05-02 MED ORDER — DOCUSATE SODIUM 100 MG PO CAPS
100.0000 mg | ORAL_CAPSULE | Freq: Two times a day (BID) | ORAL | Status: DC
  Administered 2020-05-02 – 2020-05-03 (×2): 100 mg via ORAL
  Filled 2020-05-02 (×2): qty 1

## 2020-05-02 MED ORDER — ALUM & MAG HYDROXIDE-SIMETH 200-200-20 MG/5ML PO SUSP: 30.0000 mL | ORAL | Status: DC | PRN

## 2020-05-02 MED ORDER — MAGNESIUM HYDROXIDE 400 MG/5ML PO SUSP: 30.0000 mL | Freq: Every day | ORAL | Status: DC | PRN

## 2020-05-02 MED ORDER — KETOROLAC TROMETHAMINE 30 MG/ML IJ SOLN
INTRAMUSCULAR | Status: AC
  Administered 2020-05-02: 30 mg via INTRAVENOUS
  Filled 2020-05-02: qty 1

## 2020-05-02 MED ORDER — SODIUM CHLORIDE 0.9 % IV SOLN: INTRAVENOUS | Status: DC

## 2020-05-02 MED ORDER — DEXMEDETOMIDINE HCL IN NACL 200 MCG/50ML IV SOLN
INTRAVENOUS | Status: DC | PRN
  Administered 2020-05-02: 8 ug via INTRAVENOUS

## 2020-05-02 MED ORDER — ONDANSETRON HCL 4 MG/2ML IJ SOLN
INTRAMUSCULAR | Status: AC
  Filled 2020-05-02: qty 2

## 2020-05-02 MED ORDER — ATORVASTATIN CALCIUM 10 MG PO TABS
10.0000 mg | ORAL_TABLET | Freq: Every day | ORAL | Status: DC
  Administered 2020-05-03: 10 mg via ORAL
  Filled 2020-05-02: qty 1

## 2020-05-02 MED ORDER — FAMOTIDINE 20 MG PO TABS: 20.0000 mg | ORAL_TABLET | Freq: Once | ORAL | Status: AC

## 2020-05-02 MED ORDER — TRANEXAMIC ACID 1000 MG/10ML IV SOLN
INTRAVENOUS | Status: AC
  Filled 2020-05-02: qty 10

## 2020-05-02 MED ORDER — RIVAROXABAN 20 MG PO TABS
20.0000 mg | ORAL_TABLET | Freq: Every day | ORAL | Status: DC
  Filled 2020-05-02: qty 1

## 2020-05-02 MED ORDER — BUPIVACAINE HCL (PF) 0.5 % IJ SOLN
INTRAMUSCULAR | Status: DC | PRN
  Administered 2020-05-02: 2.6 mL

## 2020-05-02 MED ORDER — FAMOTIDINE 20 MG PO TABS
ORAL_TABLET | ORAL | Status: AC
  Administered 2020-05-02: 20 mg via ORAL
  Filled 2020-05-02: qty 1

## 2020-05-02 MED ORDER — KETOROLAC TROMETHAMINE 30 MG/ML IJ SOLN: 30.0000 mg | Freq: Once | INTRAMUSCULAR | Status: AC

## 2020-05-02 MED ORDER — PROPOFOL 500 MG/50ML IV EMUL
INTRAVENOUS | Status: AC
  Filled 2020-05-02: qty 50

## 2020-05-02 MED ORDER — SODIUM CHLORIDE 0.9 % IV SOLN
INTRAVENOUS | Status: DC | PRN
  Administered 2020-05-02: 11:00:00 90 mL

## 2020-05-02 MED ORDER — ONDANSETRON HCL 4 MG/2ML IJ SOLN
INTRAMUSCULAR | Status: DC | PRN
  Administered 2020-05-02: 4 mg via INTRAVENOUS

## 2020-05-02 MED ORDER — PROPOFOL 10 MG/ML IV BOLUS
INTRAVENOUS | Status: AC
  Filled 2020-05-02: qty 20

## 2020-05-02 MED ORDER — CEFAZOLIN SODIUM-DEXTROSE 2-4 GM/100ML-% IV SOLN
2.0000 g | Freq: Four times a day (QID) | INTRAVENOUS | Status: AC
  Administered 2020-05-02 – 2020-05-03 (×3): 2 g via INTRAVENOUS
  Filled 2020-05-02 (×3): qty 100

## 2020-05-02 MED ORDER — DEXAMETHASONE SODIUM PHOSPHATE 10 MG/ML IJ SOLN
INTRAMUSCULAR | Status: AC
  Filled 2020-05-02: qty 1

## 2020-05-02 MED ORDER — MIDAZOLAM HCL 2 MG/2ML IJ SOLN
INTRAMUSCULAR | Status: AC
  Filled 2020-05-02: qty 2

## 2020-05-02 MED ORDER — FLEET ENEMA 7-19 GM/118ML RE ENEM: 1.0000 | ENEMA | Freq: Once | RECTAL | Status: DC | PRN

## 2020-05-02 MED ORDER — RIVAROXABAN 15 MG PO TABS
15.0000 mg | ORAL_TABLET | Freq: Two times a day (BID) | ORAL | Status: DC
  Administered 2020-05-03: 15 mg via ORAL
  Filled 2020-05-02 (×2): qty 1

## 2020-05-02 MED ORDER — DEXAMETHASONE SODIUM PHOSPHATE 4 MG/ML IJ SOLN
INTRAMUSCULAR | Status: DC | PRN
Start: 2020-05-02 — End: 2020-05-02
  Administered 2020-05-02: 4 mg via INTRAVENOUS

## 2020-05-02 MED ORDER — FENTANYL CITRATE (PF) 100 MCG/2ML IJ SOLN
25.0000 ug | INTRAMUSCULAR | Status: DC | PRN
  Administered 2020-05-02: 15:00:00 25 ug via INTRAVENOUS

## 2020-05-02 MED ORDER — VENLAFAXINE HCL ER 150 MG PO CP24
150.0000 mg | ORAL_CAPSULE | Freq: Every day | ORAL | Status: DC
  Administered 2020-05-03: 08:00:00 150 mg via ORAL
  Filled 2020-05-02: qty 1

## 2020-05-02 MED ORDER — LACTATED RINGERS IV SOLN: INTRAVENOUS | Status: DC

## 2020-05-02 MED ORDER — ACETAMINOPHEN 10 MG/ML IV SOLN
INTRAVENOUS | Status: DC | PRN
  Administered 2020-05-02: 1000 mg via INTRAVENOUS

## 2020-05-02 MED ORDER — KETOROLAC TROMETHAMINE 15 MG/ML IJ SOLN
15.0000 mg | Freq: Four times a day (QID) | INTRAMUSCULAR | Status: DC
  Administered 2020-05-02 (×2): 15 mg via INTRAVENOUS
  Filled 2020-05-02 (×3): qty 1

## 2020-05-02 MED ORDER — ONDANSETRON HCL 4 MG PO TABS: 4.0000 mg | ORAL_TABLET | Freq: Four times a day (QID) | ORAL | Status: DC | PRN

## 2020-05-02 MED ORDER — FENTANYL CITRATE (PF) 100 MCG/2ML IJ SOLN
INTRAMUSCULAR | Status: AC
  Administered 2020-05-02: 25 ug via INTRAVENOUS
  Filled 2020-05-02: qty 2

## 2020-05-02 MED ORDER — DIPHENHYDRAMINE HCL 12.5 MG/5ML PO ELIX: 12.5000 mg | ORAL_SOLUTION | ORAL | Status: DC | PRN

## 2020-05-02 MED ORDER — SODIUM CHLORIDE FLUSH 0.9 % IV SOLN
INTRAVENOUS | Status: AC
  Filled 2020-05-02: qty 20

## 2020-05-02 MED ORDER — ACETAMINOPHEN 500 MG PO TABS
1000.0000 mg | ORAL_TABLET | Freq: Four times a day (QID) | ORAL | Status: DC
  Administered 2020-05-02 – 2020-05-03 (×3): 1000 mg via ORAL
  Filled 2020-05-02 (×3): qty 2

## 2020-05-02 MED ORDER — ZOLPIDEM TARTRATE 5 MG PO TABS
5.0000 mg | ORAL_TABLET | Freq: Every evening | ORAL | Status: DC | PRN
  Administered 2020-05-02: 5 mg via ORAL
  Filled 2020-05-02: qty 1

## 2020-05-02 MED ORDER — BISACODYL 10 MG RE SUPP: 10.0000 mg | Freq: Every day | RECTAL | Status: DC | PRN

## 2020-05-02 MED ORDER — ONDANSETRON HCL 4 MG/2ML IJ SOLN: 4.0000 mg | Freq: Four times a day (QID) | INTRAMUSCULAR | Status: DC | PRN

## 2020-05-02 MED ORDER — FENTANYL CITRATE (PF) 100 MCG/2ML IJ SOLN
INTRAMUSCULAR | Status: AC
  Filled 2020-05-02: qty 2

## 2020-05-02 MED ORDER — GABAPENTIN 100 MG PO CAPS
100.0000 mg | ORAL_CAPSULE | Freq: Every day | ORAL | Status: DC
  Administered 2020-05-02: 100 mg via ORAL
  Filled 2020-05-02: qty 1

## 2020-05-02 MED ORDER — FENTANYL CITRATE (PF) 100 MCG/2ML IJ SOLN
INTRAMUSCULAR | Status: DC | PRN
  Administered 2020-05-02: 100 ug via INTRAVENOUS
  Administered 2020-05-02 (×2): 50 ug via INTRAVENOUS

## 2020-05-02 MED ORDER — OXYCODONE HCL 5 MG PO TABS
ORAL_TABLET | ORAL | Status: AC
  Filled 2020-05-02: qty 1

## 2020-05-02 MED ORDER — CEFAZOLIN SODIUM-DEXTROSE 2-4 GM/100ML-% IV SOLN
2.0000 g | INTRAVENOUS | Status: AC
  Administered 2020-05-02: 2 g via INTRAVENOUS

## 2020-05-02 MED ORDER — ACETAMINOPHEN 325 MG PO TABS: 325.0000 mg | ORAL_TABLET | Freq: Four times a day (QID) | ORAL | Status: DC | PRN

## 2020-05-02 MED ORDER — BUPIVACAINE-EPINEPHRINE (PF) 0.5% -1:200000 IJ SOLN: INTRAMUSCULAR | Status: DC | PRN

## 2020-05-02 MED ORDER — ACETAMINOPHEN 10 MG/ML IV SOLN
INTRAVENOUS | Status: AC
  Filled 2020-05-02: qty 100

## 2020-05-02 MED ORDER — PHENYLEPHRINE HCL (PRESSORS) 10 MG/ML IV SOLN
INTRAVENOUS | Status: AC
  Filled 2020-05-02: qty 1

## 2020-05-02 MED ORDER — PROPOFOL 10 MG/ML IV BOLUS
INTRAVENOUS | Status: DC | PRN
  Administered 2020-05-02 (×2): 40 mg via INTRAVENOUS

## 2020-05-02 MED ORDER — PHENTERMINE HCL 37.5 MG PO TABS: 37.5000 mg | ORAL_TABLET | Freq: Every day | ORAL | Status: DC

## 2020-05-02 MED ORDER — PROPOFOL 500 MG/50ML IV EMUL
INTRAVENOUS | Status: DC | PRN
  Administered 2020-05-02: 100 ug/kg/min via INTRAVENOUS

## 2020-05-02 MED ORDER — METOCLOPRAMIDE HCL 5 MG/ML IJ SOLN: 5.0000 mg | Freq: Three times a day (TID) | INTRAMUSCULAR | Status: DC | PRN

## 2020-05-02 MED ORDER — HYDROMORPHONE HCL 1 MG/ML IJ SOLN: 0.2500 mg | INTRAMUSCULAR | Status: DC | PRN

## 2020-05-02 MED ORDER — OXYCODONE HCL 5 MG PO TABS
5.0000 mg | ORAL_TABLET | Freq: Once | ORAL | Status: AC | PRN
  Administered 2020-05-02: 5 mg via ORAL

## 2020-05-02 MED ORDER — CEFAZOLIN SODIUM-DEXTROSE 2-4 GM/100ML-% IV SOLN
INTRAVENOUS | Status: AC
  Filled 2020-05-02: qty 100

## 2020-05-02 MED ORDER — BUPIVACAINE HCL (PF) 0.5 % IJ SOLN
INTRAMUSCULAR | Status: AC
  Filled 2020-05-02: qty 30

## 2020-05-02 SURGICAL SUPPLY — 64 items
BNDG ELASTIC 6X5.8 VLCR STR LF (GAUZE/BANDAGES/DRESSINGS) ×3 IMPLANT
CANISTER SUCT 1200ML W/VALVE (MISCELLANEOUS) ×3 IMPLANT
CANISTER SUCT 3000ML PPV (MISCELLANEOUS) ×3 IMPLANT
CEMENT BONE R 1X40 (Cement) ×3 IMPLANT
CEMENT VACUUM MIXING SYSTEM (MISCELLANEOUS) ×3 IMPLANT
CHLORAPREP W/TINT 26 (MISCELLANEOUS) ×6 IMPLANT
COOLER POLAR GLACIER W/PUMP (MISCELLANEOUS) ×3 IMPLANT
COVER MAYO STAND REUSABLE (DRAPES) ×3 IMPLANT
COVER WAND RF STERILE (DRAPES) ×3 IMPLANT
CUFF TOURN SGL QUICK 24 (TOURNIQUET CUFF) ×2
CUFF TOURN SGL QUICK 30 (TOURNIQUET CUFF)
CUFF TRNQT CYL 24X4X16.5-23 (TOURNIQUET CUFF) ×1 IMPLANT
CUFF TRNQT CYL 30X4X21-28X (TOURNIQUET CUFF) IMPLANT
DRAPE C-ARM XRAY 36X54 (DRAPES) ×3 IMPLANT
DRSG OPSITE POSTOP 4X12 (GAUZE/BANDAGES/DRESSINGS) IMPLANT
DRSG OPSITE POSTOP 4X14 (GAUZE/BANDAGES/DRESSINGS) ×3 IMPLANT
DRSG OPSITE POSTOP 4X6 (GAUZE/BANDAGES/DRESSINGS) ×3 IMPLANT
ELECT CAUTERY BLADE 6.4 (BLADE) ×3 IMPLANT
ELECT REM PT RETURN 9FT ADLT (ELECTROSURGICAL) ×3
ELECTRODE REM PT RTRN 9FT ADLT (ELECTROSURGICAL) ×1 IMPLANT
GAUZE 4X4 16PLY RFD (DISPOSABLE) ×3 IMPLANT
GAUZE SPONGE 4X4 12PLY STRL (GAUZE/BANDAGES/DRESSINGS) ×3 IMPLANT
GAUZE XEROFORM 1X8 LF (GAUZE/BANDAGES/DRESSINGS) ×3 IMPLANT
GLOVE BIO SURGEON STRL SZ7.5 (GLOVE) ×12 IMPLANT
GLOVE BIO SURGEON STRL SZ8 (GLOVE) ×12 IMPLANT
GLOVE BIOGEL PI IND STRL 8 (GLOVE) ×1 IMPLANT
GLOVE BIOGEL PI INDICATOR 8 (GLOVE) ×2
GLOVE INDICATOR 8.0 STRL GRN (GLOVE) ×3 IMPLANT
GOWN STRL REUS W/ TWL LRG LVL3 (GOWN DISPOSABLE) ×1 IMPLANT
GOWN STRL REUS W/ TWL XL LVL3 (GOWN DISPOSABLE) ×1 IMPLANT
GOWN STRL REUS W/TWL LRG LVL3 (GOWN DISPOSABLE) ×2
GOWN STRL REUS W/TWL XL LVL3 (GOWN DISPOSABLE) ×2
HOLDER FOLEY CATH W/STRAP (MISCELLANEOUS) ×3 IMPLANT
HOOD PEEL AWAY FLYTE STAYCOOL (MISCELLANEOUS) ×9 IMPLANT
KIT TURNOVER KIT A (KITS) ×3 IMPLANT
KNEE PARTIAL CEMENT FEM XS (Miscellaneous) ×3 IMPLANT
KNEE PARTIAL MENISCAL XS LT (Miscellaneous) ×3 IMPLANT
MAT ABSORB  FLUID 56X50 GRAY (MISCELLANEOUS) ×2
MAT ABSORB FLUID 56X50 GRAY (MISCELLANEOUS) ×1 IMPLANT
NDL SAFETY ECLIPSE 18X1.5 (NEEDLE) ×1 IMPLANT
NEEDLE HYPO 18GX1.5 SHARP (NEEDLE) ×2
NEEDLE SPNL 20GX3.5 QUINCKE YW (NEEDLE) ×3 IMPLANT
NS IRRIG 1000ML POUR BTL (IV SOLUTION) ×3 IMPLANT
PACK BLADE SAW RECIP 70 3 PT (BLADE) ×3 IMPLANT
PACK TOTAL KNEE (MISCELLANEOUS) ×3 IMPLANT
PAD WRAPON POLAR KNEE (MISCELLANEOUS) ×1 IMPLANT
PULSAVAC PLUS IRRIG FAN TIP (DISPOSABLE) ×3
SOL .9 NS 3000ML IRR  AL (IV SOLUTION) ×2
SOL .9 NS 3000ML IRR UROMATIC (IV SOLUTION) ×1 IMPLANT
STAPLER SKIN PROX 35W (STAPLE) ×3 IMPLANT
STRAP SAFETY 5IN WIDE (MISCELLANEOUS) ×3 IMPLANT
SUCTION FRAZIER HANDLE 10FR (MISCELLANEOUS) ×2
SUCTION TUBE FRAZIER 10FR DISP (MISCELLANEOUS) ×1 IMPLANT
SUT VIC AB 0 CT1 36 (SUTURE) ×3 IMPLANT
SUT VIC AB 2-0 CT1 27 (SUTURE) ×8
SUT VIC AB 2-0 CT1 TAPERPNT 27 (SUTURE) ×4 IMPLANT
SYR 10ML LL (SYRINGE) ×3 IMPLANT
SYR 20ML LL LF (SYRINGE) ×3 IMPLANT
SYR 30ML LL (SYRINGE) ×9 IMPLANT
TAPE TRANSPORE STRL 2 31045 (GAUZE/BANDAGES/DRESSINGS) ×3 IMPLANT
TIP FAN IRRIG PULSAVAC PLUS (DISPOSABLE) ×1 IMPLANT
TRAY FOLEY MTR SLVR 16FR STAT (SET/KITS/TRAYS/PACK) IMPLANT
TRAY TIBIAL KNEE OXFORD STD AA (Joint) ×3 IMPLANT
WRAPON POLAR PAD KNEE (MISCELLANEOUS) ×3

## 2020-05-02 NOTE — H&P (Signed)
History of Present Illness: The patient is a 54 year old female that presented for history and physical for an upcoming left medial partial knee replacement to be done by Dr. Joice Lofts on May 02, 2020. The patient has and a long history of left knee pain with instability. She had an MRI done on April 25, 2020 revealing a large posterior horn medial meniscus root tear with prominent degenerative chondral thinning of the medial compartment and mild thinning of the patellofemoral compartment. There is partial thickness chondral loss laterally with moderate effusion. The patient continues to have instability and pain with squatting or walking. She has a history of a total knee replacement on the right done in November 2018. She has a history of blood clot with previous surgery. She is currently taking aspirin daily. The patient is not a diabetic.  Medications Current Outpatient Medications  Medication Sig Dispense Refill  . aspirin 81 MG EC tablet Take 81 mg by mouth once daily  . atorvastatin (LIPITOR) 10 MG tablet TAKE 1 TABLET BY MOUTH EVERY DAY 90 tablet 3  . fluticasone propionate (FLONASE) 50 mcg/actuation nasal spray Place 1 spray into both nostrils 2 (two) times daily 16 g 0  . phentermine (ADIPEX-P) 37.5 mg tablet Take 1 tablet (37.5 mg total) by mouth every morning before breakfast for 30 days 30 tablet 0  . promethazine-dextromethorphan (PROMETHAZINE-DM) 6.25-15 mg/5 mL syrup Take 5 mLs by mouth every 6 (six) hours as needed for Cough 120 mL 0  . QUEtiapine (SEROQUEL) 25 MG tablet TAKE 1 TABLET BY MOUTH EVERYDAY AT BEDTIME  . venlafaxine (EFFEXOR-XR) 150 MG XR capsule TAKE 1 CAPSULE BY MOUTH EVERY DAY 30 capsule 11   Current Facility-Administered Medications  Medication Dose Route Frequency Provider Last Rate Last Admin  . cyanocobalamin (VITAMIN B12) injection 1,000 mcg 1,000 mcg Intramuscular Q30 Days Marguarite Arbour, MD 1,000 mcg at 05/01/20 0930   Allergies Tramadol and Latex, natural  rubber  Histories Past Medical History: Past Medical History:  Diagnosis Date  . Diverticulitis  . IBS (irritable bowel syndrome)  . Migraine  . Osteoarthritis  . Seasonal allergies  . Vitamin B 12 deficiency   Past Surgical History: Past Surgical History:  Procedure Laterality Date  . Right foot surgery  . 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 CHOLECYSTECTOMY  . Colon polypectomy 08/09/2009  Hyperplastic Polyps: CBF 07/2019  . EGD 08/09/2009  No repeat per RTE  . ENDOMETRIAL ABLATION  . HYSTERECTOMY  TVH 03/2018  . Manipulation under anesthesia with steroid injection right knee Right 02/02/2018  DR.Johnell Bas  . Right total knee using all-cemented biomet vanguard system with a 62.5 mm PCR femur a 67 mm tibial tray with a 10 mm E-poly insert and a 31 x 8 mm all poly3-pegged domed patella Right 11/11/2017  Dr.Anaja Monts  . Sigmoid colectomy  . TUBAL LIGATION   Social History: Social History   Socioeconomic History  . Marital status: Married  Spouse name: Not on file  . Number of children: Not on file  . Years of education: Not on file  . Highest education level: Not on file  Occupational History  . Not on file  Tobacco Use  . Smoking status: Former Smoker  Quit date: 06/28/2016  Years since quitting: 3.8  . Smokeless tobacco: Never Used  Vaping Use  . Vaping Use: Never used  Substance and Sexual Activity  . Alcohol use: Yes  Alcohol/week: 0.0 standard drinks  Comment: drinks on weekends  . Drug use: No  . Sexual activity: Yes  Birth control/protection: Surgical  Comment: Tubal  Other Topics Concern  . Not on file  Social History Narrative  . Not on file   Social Determinants of Health   Financial Resource Strain:  . Difficulty of  Paying Living Expenses:  Food Insecurity:  . Worried About Charity fundraiser in the Last Year:  . Arboriculturist in the Last Year:  Transportation Needs:  . Film/video editor (Medical):  Marland Kitchen Lack of Transportation (Non-Medical):  Physical Activity:  . Days of Exercise per Week:  . Minutes of Exercise per Session:  Stress:  . Feeling of Stress :  Social Connections:  . Frequency of Communication with Friends and Family:  . Frequency of Social Gatherings with Friends and Family:  . Attends Religious Services:  . Active Member of Clubs or Organizations:  . Attends Archivist Meetings:  Marland Kitchen Marital Status:   Family History: Family History  Problem Relation Age of Onset  . Myocardial Infarction (Heart attack) Father  . High blood pressure (Hypertension) Father  . Stroke Father  . Prostate cancer Father  . Lung cancer Father  . Prostate cancer Brother   Review of Systems A comprehensive 14 point ROS was performed, reviewed, and the pertinent orthopaedic findings are documented in the HPI.  Physical Exam: BP 118/81  Pulse 75  Ht 154.9 cm (5\' 1" )  Wt 68.9 kg (152 lb)  LMP 09/09/2016 (Approximate)  BMI 28.72 kg/m   General/Constitutional: NAD, conversant Eyes: Pupils equal and round, extraocular movements intact ENT: atraumatic external nose and ears, moist mucous membranes Respiratory: non-labored breathing, symmetric chest rise, chest sounds clear. Cardiovascular: no visible lower extremity edema, peripheral pulses present, regular rate and rhythm  Skin: normal skin turgor, warm and dry Neurological: cranial nerves grossly intact, sensation grossly intact Psychological: Appropriate mood and affect; appropriate judgment Musculoskeletal: as detailed below:  General: Well developed, well nourished 54 y.o. female in no apparent distress. Normal affect. Normal communication. Patient answers questions appropriately. The patient has a left-sided limping gait.  There is no antalgic component. There is no hip lurch.   Heart: Examination of the heart reveals regular, rate, and rhythm. There is no murmur noted on ascultation. There is a normal apical pulse.  Lungs: Lungs are clear to auscultation. There is no wheeze, rhonchi, or crackles. There is normal expansion of bilateral chest walls.   Left lower Extremities: Examination of the left lower extremity reveals no bony abnormality, no edema, minimal effusion and no ecchymosis. There is no valgus or varus abnormality. The patient is non-tender along the lateral joint line, and is tender along the medial joint line. The patient has full knee flexion and extension. There is flexion and full extension discomfort with range of motion exercises. The patient has a positive medial rotational Mcmurray test. There is no retropatellar discomfort. The patient has a negative patella stretch test. The patient has a negative varus stress test and a negative valgus stress test, in looking for stability. The patient has a negative Lachman's test.  Vascular: The patient has a negative Bevelyn Buckles' test bilaterally. The patient had a normal dorsalis pedis and posterior tibial pulse. There is normal skin warmth. There is normal capillary refill bilaterally.   Neurologic: The patient has a negative straight leg raise. The patient has normal muscle strength testing  for the quadriceps, calves, ankle dorsiflexion, ankle plantarflexion, and extensor hallicus longus. The patient has sensation that is intact to light touch. The deep tendon reflexes are normal at the patella and achilles. No clonus is noted.   MRI Results: MRI of the left knee was completed on April 25, 2020. The MRI revealed a large radial posterior horn medial meniscus root tear. There is prominent degenerative chondral thinning of the medial compartment with mild chondral thinning of the patellofemoral joint. There is 0.6 cm partial-thickness chondral defect posteriorly  in the lateral tibial plateau with moderate effusion.   Impression: . Primary osteoarthritis of left knee  . Degenerative tear of left medial meniscus   Plan:  1. I discussed the physical exam finding and MRI results. We discussed treatment with Dr. Joice Lofts. 2. I discussed doing a left medial compartment partial knee replacement to be done on May 02, 2020 by Dr. Joice Lofts. I reviewed the risk, benefits, and alternatives to the surgery. The patient has a history of DVT and we discussed the use of aspirin. They will use postoperative DVT prophylaxis. 3. The patient will follow up after surgery for postoperative care and will do home health physical therapy. 4. She will be out of work for up to 6 weeks. 5. The patient will follow-up 2 weeks after surgery for staple removal and postoperative care.  This note will serve as the history and physical for surgery scheduled on May 02, 2020 for a left medial compartment partial knee replacement to be done by Dr. Joice Lofts.    H&P reviewed and patient re-examined. No changes.

## 2020-05-02 NOTE — Evaluation (Signed)
Physical Therapy Evaluation Patient Details Name: Krista Allen MRN: [id number] DOB: 1966/04/10 Today's Date: 05/02/2020   History of Present Illness  54 y/o female s/p L unicompartmental knee replacement (meniscal root tear).  H/O R TKA with post-op clotting complications ~ 3 years ago.  Clinical Impression  Pt did very well with POD0 PT exam.  She was able to circumambulate the nurses station with safe, confidence and consistent cadence.  She easily did 10 SLRs with minimal warm up, showed 0-121 degrees of AROM and generally had no issues with mobility, transfers, etc.  She is having a lot of pain and asks for pain meds t/o the session, nursing unable to give them at this time but will when the timing allows.  Pt plans to leave tomorrow and should safely be able to do so after stair training and education.     Follow Up Recommendations Follow surgeon's recommendation for DC plan and follow-up therapies    Equipment Recommendations  None recommended by PT    Recommendations for Other Services       Precautions / Restrictions Precautions Precautions: Fall Restrictions Weight Bearing Restrictions: Yes LLE Weight Bearing: Weight bearing as tolerated      Mobility  Bed Mobility Overal bed mobility: Independent             General bed mobility comments: easily gets to sitting EOB w/o assist  Transfers Overall transfer level: Modified independent Equipment used: Rolling walker (2 wheeled)             General transfer comment: Minimal cuing for set up/hand placement, but rises w/o assist   Ambulation/Gait Ambulation/Gait assistance: Modified independent (Device/Increase time) Gait Distance (Feet): 200 Feet Assistive device: Rolling walker (2 wheeled)       General Gait Details: Pt able to maintain consistent, safe and confidence cadence t/o circumambulation of the loop.  Pt has one small "catch" (near buckle) with no overt safety issues.    Stairs             Wheelchair Mobility    Modified Rankin (Stroke Patients Only)       Balance Overall balance assessment: Modified Independent                                           Pertinent Vitals/Pain Pain Assessment: 0-10 Pain Score: 7  Pain Location: L knee    Home Living Family/patient expects to be discharged to:: Private residence Living Arrangements: Spouse/significant other Available Help at Discharge: Family;Available 24 hours/day(husband will be home for the first few days)   Home Access: Stairs to enter Entrance Stairs-Rails: None Entrance Stairs-Number of Steps: 3   Home Equipment: Walker - 2 wheels;Cane - single point(pt not fully sure but feels she still has most DME )      Prior Function Level of Independence: Independent         Comments: Pt works, works out, cares for 9 dogs and a baby opposum, active     Journalist, newspaper        Extremity/Trunk Assessment   Upper Extremity Assessment Upper Extremity Assessment: Overall WFL for tasks assessed    Lower Extremity Assessment Lower Extremity Assessment: Overall WFL for tasks assessed(expected post op weakness)       Communication   Communication: No difficulties  Cognition Arousal/Alertness: Awake/alert Behavior During Therapy: WFL for tasks assessed/performed Overall Cognitive Status: Within  Functional Limits for tasks assessed                                        General Comments      Exercises Total Joint Exercises Ankle Circles/Pumps: AROM;10 reps Quad Sets: Strengthening;10 reps Heel Slides: Strengthening;5 reps Hip ABduction/ADduction: Strengthening;10 reps Straight Leg Raises: AROM;10 reps Long Arc Quad: Strengthening;5 reps Knee Flexion: 5 reps;AAROM Goniometric ROM: AROM 0-120   Assessment/Plan    PT Assessment Patient needs continued PT services  PT Problem List Decreased strength;Decreased range of motion;Decreased activity  tolerance;Decreased balance;Decreased mobility;Decreased coordination;Decreased knowledge of use of DME;Decreased safety awareness;Pain       PT Treatment Interventions DME instruction;Gait training;Stair training;Functional mobility training;Therapeutic activities;Therapeutic exercise;Balance training;Neuromuscular re-education;Patient/family education    PT Goals (Current goals can be found in the Care Plan section)  Acute Rehab PT Goals Patient Stated Goal: get back to working out PT Goal Formulation: With patient Time For Goal Achievement: 05/16/20 Potential to Achieve Goals: Good    Frequency BID   Barriers to discharge        Co-evaluation               AM-PAC PT "6 Clicks" Mobility  Outcome Measure Help needed turning from your back to your side while in a flat bed without using bedrails?: None Help needed moving from lying on your back to sitting on the side of a flat bed without using bedrails?: None Help needed moving to and from a bed to a chair (including a wheelchair)?: None Help needed standing up from a chair using your arms (e.g., wheelchair or bedside chair)?: None Help needed to walk in hospital room?: None Help needed climbing 3-5 steps with a railing? : None 6 Click Score: 24    End of Session Equipment Utilized During Treatment: Gait belt Activity Tolerance: Patient tolerated treatment well Patient left: with bed alarm set;with call bell/phone within reach Nurse Communication: Mobility status;Patient requests pain meds PT Visit Diagnosis: Muscle weakness (generalized) (M62.81);Difficulty in walking, not elsewhere classified (R26.2);Pain Pain - Right/Left: Left Pain - part of body: Knee    Time: 5638-7564 PT Time Calculation (min) (ACUTE ONLY): 29 min   Charges:   PT Evaluation $PT Eval Low Complexity: 1 Low PT Treatments $Therapeutic Exercise: 8-22 mins        Malachi Pro, DPT 05/02/2020, 5:49 PM

## 2020-05-02 NOTE — Op Note (Signed)
05/02/2020  12:35 PM  Patient:   Krista Allen  Pre-Op Diagnosis:   Displaced medial meniscal root tear with advanced osteoarthritis of medial compartment, left knee.  Post-Op Diagnosis:   Same  Procedure:   Left unicondylar knee arthroplasty.  Surgeon:   Pascal Lux, MD  Assistant:   Lafe Garin, RNFA  Anesthesia:   Spinal  Findings:   As above.  Complications:   None  EBL:   10 cc  Fluids:   1300 cc crystalloid  UOP:   None  TT:   90 minutes at 300 mmHg  Drains:   None  Closure:   Staples  Implants:   All-cemented Biomet Oxford system with an extra small femoral component, a "AA" sized tibial tray, and a 3 mm meniscal bearing insert.  Brief Clinical Note:   The patient is a 54 year old female with a 6-week history of significant medial sided left knee pain. Her symptoms developed after stepping in a hole. An MRI scan demonstrated the presence of a displaced posterior medial meniscal root tear with advanced degenerative joint disease, primarily limited to the medial compartment. The patient presents at this time for a left partial knee replacement.  Procedure:   The patient was brought into the operating room and a spinal placed by the anesthesiologist. The patient was lain in the supine position and a Foley catheter inserted. The patient was repositioned so that the non-surgical leg was placed in a flexed and abducted position in the yellow fin leg holder while the surgical extremity was placed over the Biomet leg holder. The left lower extremity was prepped with ChloraPrep solution before being draped sterilely. Preoperative antibiotics were administered. After performing a timeout to verify the appropriate surgical site, the limb was exsanguinated with an Esmarch and the tourniquet inflated to 300 mmHg.   A standard anterior approach to the knee was made through an approximately 3.5-4 inch incision. The incision was carried down through the subcutaneous tissues  to expose the superficial retinaculum. This was split the length the incision and the medial flap elevated sufficiently to expose the medial retinaculum. The medial retinaculum was incised along the medial border of the patella tendon and extended proximally along the medial border of the patella, leaving a 3-4 mm cuff of tissue. The soft tissues were elevated off the anteromedial aspect of the proximal tibia. The anterior portion of the meniscus was removed after performing a subtotal excision of the infrapatellar fat pad. The anterior cruciate ligament was inspected and found to be in excellent condition. Osteophytes were removed from the inferior pole of the patella as well as from the notch using a quarter-inch osteotome. There were significant degenerative changes of both the femur and tibia on the medial side. The medial femoral condyle was sized using the small and extra small sizers. It was felt that the extra small guide best optimized the contour of the femur. This was left in place and the external tibial guide positioned. The coupling device was used to connect the guide to the medial femoral condylar sizer to optimize appropriate orientation. Two guide pins were inserted into the cutting block before the coupling device and sizer were removed. The appropriate tibial cut was made using the oscillating and reciprocating saws. The piece was removed in its entirety and taken to the back table where it was sized and found to be optimally replicated by a "AA" sized component. The 9 mm spacer was inserted to verify that sufficient bone had been removed.  Attention was directed to femoral side. The intramedullary canal was accessed through a 4 mm drill hole. The intramedullary guide was positioned before the guide for the femoral condylar holes was positioned. The appropriate coupling device connected this guide to the intramedullary guide before both drill holes were created in the distal aspect of the  medial femoral condyle. The devices were removed and the posterior condylar cutting block inserted. The appropriate cut was made using the reciprocating saw and this piece removed. The #0 spigot was inserted and the initial bone milling performed. A trial femoral component was inserted and both the flexion and extension gaps measured. In flexion, the gap measured 5 mm whereas in extension, it measured 1 mm. Therefore, the #4 spigot was selected and the secondary bone milling performed. Repeat sizing demonstrated symmetric flexion and extension gaps. The bone was removed from the postero-medial and postero-lateral aspects of the femoral condyle, as well as from the beneath the collar of the spigot. Bone also was removed from the anterior portion of the femur so as to minimize any potential impingement with the meniscal bearing insert. The trial components removed and several drill holes placed into the distal femoral condyle to further augment cement fixation.  Attention was redirected to the tibial side. The "AA neck" sized tibial tray was positioned and temporarily secured using the appropriate spiked nail. The keel was created using the bi-bladed reciprocating saw and hoe. The keeled "AA" sized trial tibial tray was inserted to be sure that it seated properly. At this point, a total of 20 cc of Exparel diluted out to 60 cc with normal saline and 30 cc of 0.5% Sensorcaine was injected in and around the posterior and medial capsular tissues, as well as the peri-incisional tissues to help with postoperative pain control.  The bony surfaces were prepared for cementing by irrigating them thoroughly with bacitracin saline solution using the jet lavage system before packing them with a dry Ray-Tec sponge. Meanwhile, cement was being mixed on the back table. When the cement was ready, the tibial tray was cemented in first. The excess cement was removed using a Public house manager after impacting it into place. Next, the  femoral component was impacted into place. Again the excess cement was removed using a Public house manager. The 4 mm spacer was inserted and the knee brought into near full extension while the cement hardened. Once the cement hardened, the spacer was removed and the 3 mm and 4 mm meniscal bearing inserts trialed. The 3 mm meniscal bearing insert demonstrated excellent tracking while the knee was placed through a range of motion, and showed no evidence towards subluxation or dislocation. In addition, it did not fit too tightly. Therefore, the permanent 3 mm meniscal bearing insert was snapped into position after verifying that no cement had been retained posteriorly. Again the knee was placed through a range of motion with the findings as described above.  The wound was copiously irrigated with sterile saline solution via the jet lavage system before the retinacular layer was reapproximated using #0 Vicryl interrupted sutures. At this point, 1 g of transexemic acid in 10 cc of normal saline was injected intra-articularly. The subcutaneous tissues were closed in two layers using 2-0 Vicryl interrupted sutures before the skin was closed using staples. A sterile occlusive dressing was applied to the knee before the patient was awakened. The patient was transferred back to her hospital bed and returned to the recovery room in satisfactory condition after tolerating the procedure well. A  Polar Care device was applied to the knee as well.

## 2020-05-02 NOTE — Transfer of Care (Signed)
Immediate Anesthesia Transfer of Care Note  Patient: Krista Allen  Procedure(s) Performed:   Patient Location: PACU  Anesthesia Type:Spinal  Level of Consciousness: awake, alert  and oriented  Airway & Oxygen Therapy: Patient Spontanous Breathing and Patient connected to face mask  Post-op Assessment: Report given to RN  Post vital signs: stable  Last Vitals:  Vitals Value Taken Time  BP 96/62 05/02/20 1223  Temp    Pulse 72 05/02/20 1226  Resp 21 05/02/20 1226  SpO2 96 % 05/02/20 1226  Vitals shown include unvalidated device data.  Last Pain:  Vitals:   05/02/20 0851  TempSrc: Temporal  PainSc: 5          Complications: No apparent anesthesia complications

## 2020-05-02 NOTE — Consult Note (Signed)
ANTICOAGULATION CONSULT NOTE - Follow Up Consult  Pharmacy Consult for Xarelto Indication: VTE treatment   Allergies  Allergen Reactions  . Latex Itching  . Other Rash    Natural Rubbers  . Tramadol Nausea And Vomiting    Can take with phenergan    Patient Measurements: Height: 5\' 2"  (157.5 cm) Weight: 68 kg (149 lb 14.6 oz) IBW/kg (Calculated) : 50.1   Vital Signs: Temp: 98.3 F (36.8 C) (05/06 1523) Temp Source: Temporal (05/06 0851) BP: 114/80 (05/06 1523) Pulse Rate: 77 (05/06 1523)  Labs: Recent Labs    05/01/20 1135  HGB 14.0  HCT 41.2  PLT 310  CREATININE 0.96    Estimated Creatinine Clearance: 59.9 mL/min (by C-G formula based on SCr of 0.96 mg/dL).   Medications:  No prior anticoagulant  Assessment: Pharmacy has been consulted for Xarelto dosing in patient for VTE treatment. Per chart review, patient had a total knee replacement November 2018 and has a h/o of blood clot with previous surgery. 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 Patient will have left unicondylar knee arthroplasty today.   Goal of Therapy:  Monitor platelets by anticoagulation protocol: Yes   Plan:  1. Xarelto 15 mg twice daily with food for 21 days followed by 20 mg once daily with food. Patient will start Xarelto tomorrow morning. Pharmacy will monitor Scr and CBC every 3 days.   December 2018 05/02/2020,3:50 PM

## 2020-05-02 NOTE — Anesthesia Procedure Notes (Signed)
Spinal  Patient location during procedure: OR Start time: 05/02/2020 10:00 AM End time: 05/02/2020 10:14 AM Staffing Performed: anesthesiologist  Preanesthetic Checklist Completed: patient identified, IV checked, site marked, risks and benefits discussed, surgical consent, monitors and equipment checked, pre-op evaluation and timeout performed Spinal Block Patient position: sitting Prep: ChloraPrep and site prepped and draped Patient monitoring: heart rate, continuous pulse ox and blood pressure Approach: midline Location: L3-4 Injection technique: single-shot Needle Needle type: Pencan  Needle gauge: 24 G Needle length: 5 cm Assessment Sensory level: T6

## 2020-05-02 NOTE — Plan of Care (Signed)
  Problem: Education: Goal: Knowledge of General Education information will improve Description: Including pain rating scale, medication(s)/side effects and non-pharmacologic comfort measures Outcome: Progressing   Problem: Health Behavior/Discharge Planning: Goal: Ability to manage health-related needs will improve Outcome: Progressing   Problem: Clinical Measurements: Goal: Ability to maintain clinical measurements within normal limits will improve Outcome: Progressing Goal: Will remain free from infection Outcome: Progressing Goal: Diagnostic test results will improve Outcome: Progressing Goal: Respiratory complications will improve Outcome: Progressing Goal: Cardiovascular complication will be avoided Outcome: Progressing   Problem: Activity: Goal: Risk for activity intolerance will decrease Outcome: Progressing   Problem: Nutrition: Goal: Adequate nutrition will be maintained Outcome: Progressing   Problem: Coping: Goal: Level of anxiety will decrease Outcome: Progressing   Problem: Elimination: Goal: Will not experience complications related to urinary retention Outcome: Progressing   Problem: Pain Managment: Goal: General experience of comfort will improve Outcome: Progressing   
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
## 2020-05-03 DIAGNOSIS — S83242A Other tear of medial meniscus, current injury, left knee, initial encounter: Secondary | ICD-10-CM | POA: Diagnosis not present

## 2020-05-03 LAB — CBC
HCT: 39 % (ref 36.0–46.0)
Hemoglobin: 12.8 g/dL (ref 12.0–15.0)
MCH: 31.1 pg (ref 26.0–34.0)
MCHC: 32.8 g/dL (ref 30.0–36.0)
MCV: 94.7 fL (ref 80.0–100.0)
Platelets: 270 10*3/uL (ref 150–400)
RBC: 4.12 MIL/uL (ref 3.87–5.11)
RDW: 12.3 % (ref 11.5–15.5)
WBC: 10.4 10*3/uL (ref 4.0–10.5)
nRBC: 0 % (ref 0.0–0.2)

## 2020-05-03 MED ORDER — RIVAROXABAN 15 MG PO TABS: 15.0000 mg | ORAL_TABLET | Freq: Two times a day (BID) | ORAL | 0 refills | Status: AC

## 2020-05-03 MED ORDER — OXYCODONE HCL 5 MG PO TABS: 5.0000 mg | ORAL_TABLET | ORAL | 0 refills | Status: AC | PRN

## 2020-05-03 MED ORDER — RIVAROXABAN 20 MG PO TABS: 20.0000 mg | ORAL_TABLET | Freq: Every day | ORAL | 0 refills | Status: AC

## 2020-05-03 NOTE — Anesthesia Postprocedure Evaluation (Signed)
Anesthesia Post Note  Patient: Krista Allen  Procedure(s) Performed: Unicompartmental knee (Left)  Patient location during evaluation: Nursing Unit Anesthesia Type: Spinal Level of consciousness: oriented and awake and alert Pain management: pain level controlled Vital Signs Assessment: post-procedure vital signs reviewed and stable Respiratory status: spontaneous breathing and respiratory function stable Cardiovascular status: blood pressure returned to baseline and stable Postop Assessment: no headache, no backache, no apparent nausea or vomiting and patient able to bend at knees Anesthetic complications: no     Last Vitals:  Vitals:   05/03/20 0009 05/03/20 0411  BP: 112/76 121/86  Pulse: (!) 59 (!) 55  Resp: 17 18  Temp: 36.7 C 36.6 C  SpO2: 95% 100%    Last Pain:  Vitals:   05/03/20 0519  TempSrc:   PainSc: Asleep                 Jovon Streetman,  Alessandra Bevels
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
## 2020-05-03 NOTE — TOC Transition Note (Signed)
Transition of Care [facility]) - CM/SW Discharge Note   Patient Details  Name: Krista Allen MRN: [id number] Date of Birth: [birth year]  [facility] CM/SW Contact:  Su Hilt, RN Phone Number: 05/03/2020, 9:31 AM   Clinical Narrative:     Met with the patient to discuss DC plan and needs She stated that she is not going to do home health and that she is going to do Outpatient PT, she stated that she already has an appointment, She stated that she has a rolling walker at home, she wanted to make sure she got xeralto for blood clots, [facility] [place] [facility], she is up to date with her PCP and has transportation with family to the doctor, no needs at this time  Final next level of care: OP Rehab Barriers to Discharge: Barriers Resolved   Patient Goals and CMS Choice Patient states their goals for this hospitalization and ongoing recovery are:: go home      Discharge Placement                       Discharge Plan and Services   Discharge Planning Services: CM Consult            DME Arranged: N/A         HH Arranged: Refused HH          Social Determinants of Health (SDOH) Interventions     Readmission Risk Interventions No flowsheet data found.
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
## 2020-05-03 NOTE — Progress Notes (Signed)
Physical Therapy Treatment Patient Details Name: Krista Allen MRN: [id number] DOB: 09-Jul-1966 Today's Date: 05/03/2020    History of Present Illness 54 y/o female s/p L unicompartmental knee replacement (meniscal root tear).  H/O R TKA with post-op clotting complications ~ 3 years ago.    PT Comments    Pt continues to do very well with all aspects of PT.  She easily completes bed mobility, is able to rise and maintain balance w/o AD, had consistent and confidence cadence with circumambulating the nurses' station and was able to negotiate up/down 4 steps with only light single HHA.  Pt has AROM >120 and displays good quad control/strength with TKE.  Overall progressing very nicely and ready/eager to go home when cleared.  Follow Up Recommendations  Follow surgeon's recommendation for DC plan and follow-up therapies     Equipment Recommendations  None recommended by PT    Recommendations for Other Services       Precautions / Restrictions Precautions Precautions: Fall Restrictions LLE Weight Bearing: Weight bearing as tolerated    Mobility  Bed Mobility Overal bed mobility: Independent             General bed mobility comments: easily gets to sitting EOB w/o assist  Transfers Overall transfer level: Independent Equipment used: None             General transfer comment: Pt actually able to rise to standing with light UE use but did not need walker to maintain balance, good confidence with L WBing  Ambulation/Gait Ambulation/Gait assistance: Modified independent (Device/Increase time) Gait Distance (Feet): 250 Feet Assistive device: Rolling walker (2 wheeled)       General Gait Details: Pt with easy confidence during ambulation, showed good effort and tolerance.  Consistent speed and no excessive fatigue with prolonged effort.     Stairs Stairs: Yes Stairs assistance: Modified independent (Device/Increase time) Stair Management: No rails(hand held  assist) Number of Stairs: 4 General stair comments: Pt was able to negotiate up/down steps with relative ease even with only single HHA to hold.  She remembered appropriate step-to strategy w/o cuing and had no safety concerns    Wheelchair Mobility    Modified Rankin (Stroke Patients Only)       Balance Overall balance assessment: Independent                                          Cognition Arousal/Alertness: Awake/alert Behavior During Therapy: WFL for tasks assessed/performed Overall Cognitive Status: Within Functional Limits for tasks assessed                                        Exercises Total Joint Exercises Ankle Circles/Pumps: AROM;10 reps Quad Sets: Strengthening;10 reps Heel Slides: Strengthening;10 reps Hip ABduction/ADduction: Strengthening;10 reps Straight Leg Raises: Strengthening;10 reps Knee Flexion: 5 reps;AAROM Goniometric ROM: 0-125 AROM    General Comments        Pertinent Vitals/Pain Pain Score: 7     Home Living                      Prior Function            PT Goals (current goals can now be found in the care plan section) Progress towards PT goals: Progressing toward goals  Frequency    BID      PT Plan Current plan remains appropriate    Co-evaluation              AM-PAC PT "6 Clicks" Mobility   Outcome Measure  Help needed turning from your back to your side while in a flat bed without using bedrails?: None Help needed moving from lying on your back to sitting on the side of a flat bed without using bedrails?: None Help needed moving to and from a bed to a chair (including a wheelchair)?: None Help needed standing up from a chair using your arms (e.g., wheelchair or bedside chair)?: None Help needed to walk in hospital room?: None Help needed climbing 3-5 steps with a railing? : None 6 Click Score: 24    End of Session Equipment Utilized During Treatment: Gait  belt Activity Tolerance: Patient tolerated treatment well Patient left: with bed alarm set;with call bell/phone within reach Nurse Communication: Mobility status;Patient requests pain meds PT Visit Diagnosis: Muscle weakness (generalized) (M62.81);Difficulty in walking, not elsewhere classified (R26.2);Pain Pain - Right/Left: Left Pain - part of body: Knee     Time: 0827-0858 PT Time Calculation (min) (ACUTE ONLY): 31 min  Charges:  $Gait Training: 8-22 mins $Therapeutic Exercise: 8-22 mins                     Kreg Shropshire, DPT 05/03/2020, 10:46 AM

## 2020-05-03 NOTE — Discharge Instructions (Signed)

## 2020-05-05 ENCOUNTER — Ambulatory Visit: Payer: Managed Care, Other (non HMO)

## 2020-07-05 ENCOUNTER — Encounter: Payer: Self-pay | Admitting: Surgery

## 2020-08-05 ENCOUNTER — Other Ambulatory Visit: Payer: Self-pay | Admitting: Internal Medicine

## 2020-08-05 DIAGNOSIS — Z1231 Encounter for screening mammogram for malignant neoplasm of breast: Secondary | ICD-10-CM

## 2021-04-14 ENCOUNTER — Other Ambulatory Visit: Payer: Self-pay | Admitting: Student

## 2021-04-14 DIAGNOSIS — Z96652 Presence of left artificial knee joint: Secondary | ICD-10-CM

## 2021-04-14 DIAGNOSIS — M25562 Pain in left knee: Secondary | ICD-10-CM

## 2021-04-14 DIAGNOSIS — S83282A Other tear of lateral meniscus, current injury, left knee, initial encounter: Secondary | ICD-10-CM

## 2021-06-06 ENCOUNTER — Encounter: Payer: Self-pay | Admitting: *Deleted

## 2021-06-06 IMAGING — MR MR KNEE*L* W/O CM
7 series · 40 of 40 positions shown · non-contrast
Comparison: None.

CLINICAL DATA: Left knee pain medially with swelling.

EXAM:
MRI OF THE LEFT KNEE WITHOUT CONTRAST
TECHNIQUE: Multiplanar, multisequence MR imaging of the knee was performed. No
intravenous contrast was administered.

[Series 8: T2 fat-sat · axial · left · 4.0mm · 0.50mm/px · z∈[-103,+21]mm · 5 of 26 slices shown (1 of 3)]
[im 1/26]
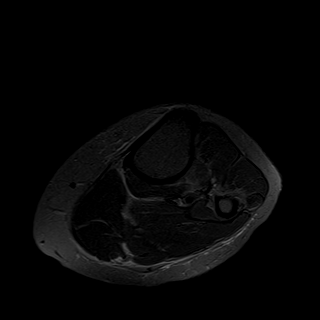
[im 7/26]
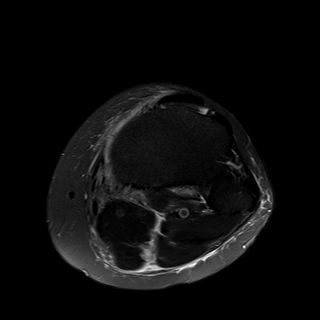
[im 13/26]
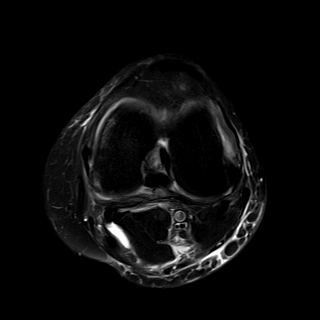
[im 19/26]
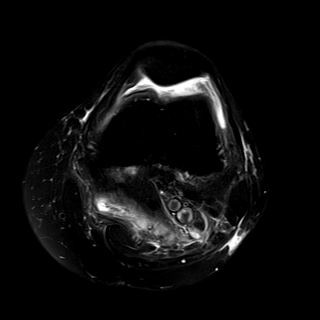
[im 26/26]
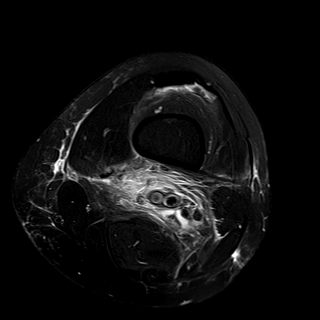

[Series 9: T2 fat-sat · coronal · left · 4.0mm · 0.59mm/px · 6 of 30 slices shown (2 of 3)]
[im 1/30]
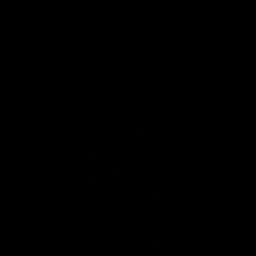
[im 6/30]
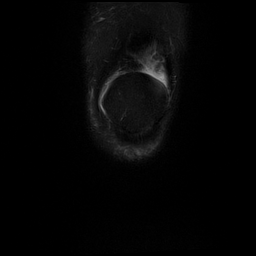
[im 12/30]
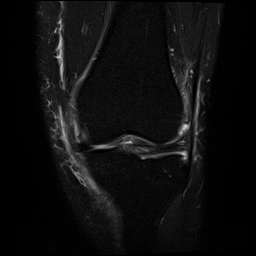
[im 18/30]
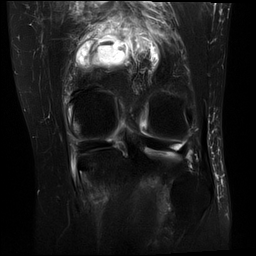
[im 24/30]
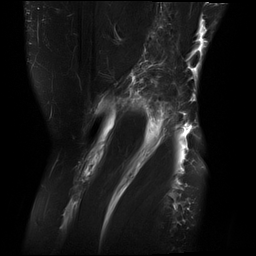
[im 30/30]
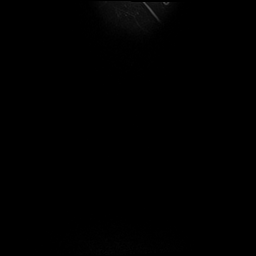

[Series 10: T1 · coronal · left · 4.0mm · 0.59mm/px · 6 of 30 slices shown]
[im 1/30]
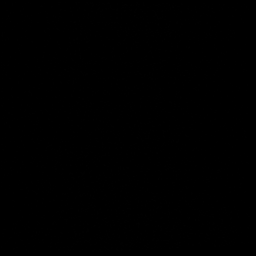
[im 6/30]
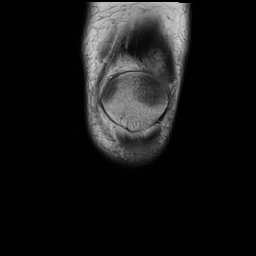
[im 12/30]
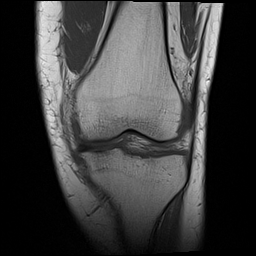
[im 18/30]
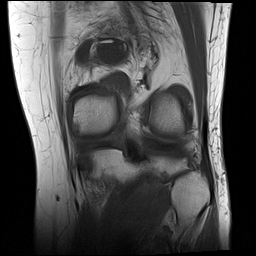
[im 24/30]
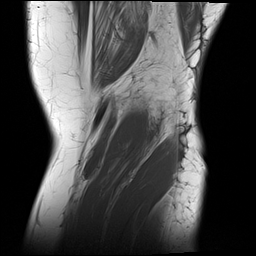
[im 30/30]
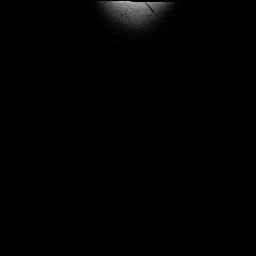

[Series 11: PD fat-sat · coronal · left · 4.0mm · 0.59mm/px · 6 of 30 slices shown (1 of 2)]
[im 1/30]
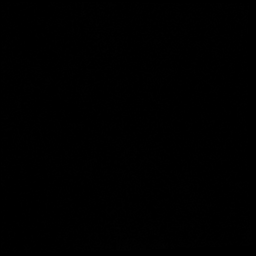
[im 6/30]
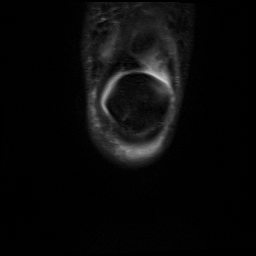
[im 12/30]
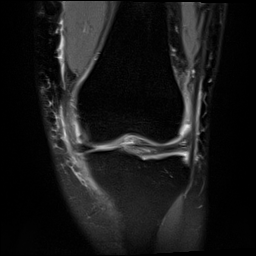
[im 18/30]
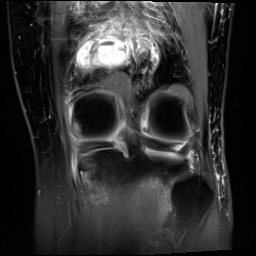
[im 24/30]
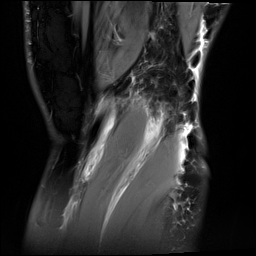
[im 30/30]
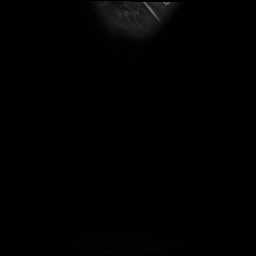

[Series 12: PD fat-sat · sagittal · left · 3.0mm · 0.59mm/px · 7 of 36 slices shown (2 of 2)]
[im 1/36]
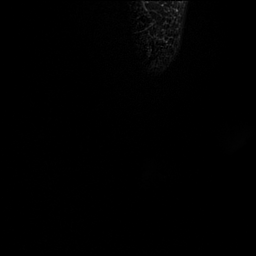
[im 6/36]
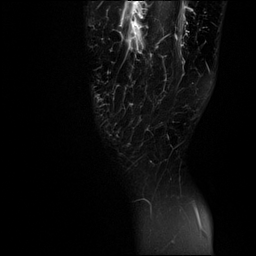
[im 12/36]
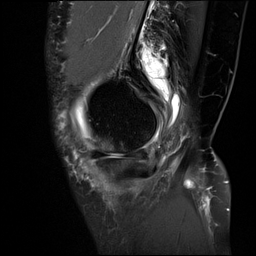
[im 18/36]
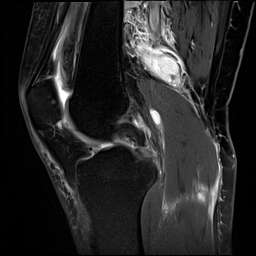
[im 24/36]
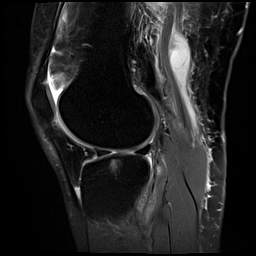
[im 30/36]
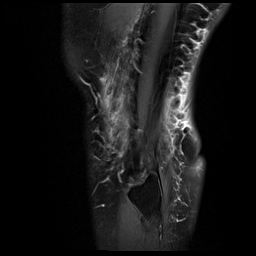
[im 36/36]
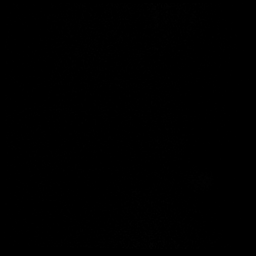

[Series 13: T2 fat-sat · sagittal · left · 3.0mm · 0.59mm/px · 7 of 36 slices shown (3 of 3)]
[im 1/36]
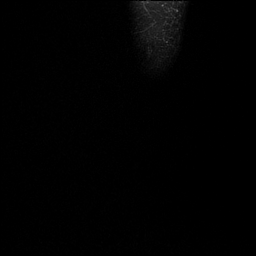
[im 6/36]
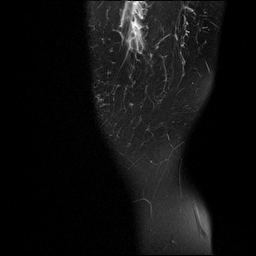
[im 12/36]
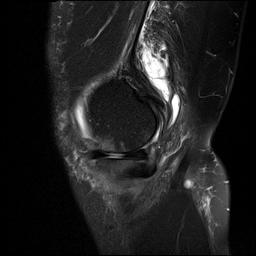
[im 18/36]
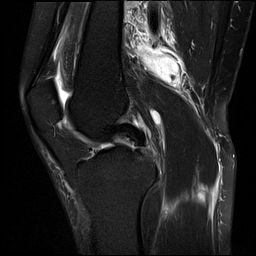
[im 24/36]
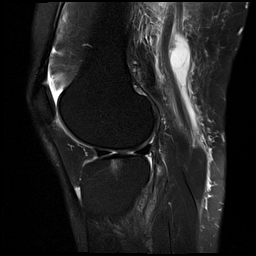
[im 30/36]
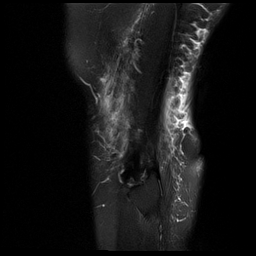
[im 36/36]
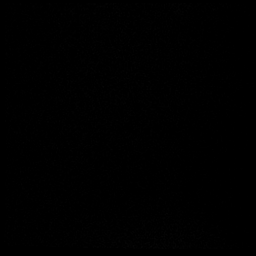

[Series 14: PD · oblique · left · 2.0mm · 0.47mm/px · 3 of 16 slices shown]
[im 1/16]
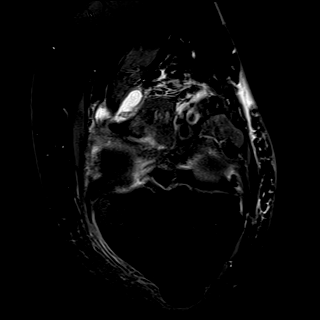
[im 8/16]
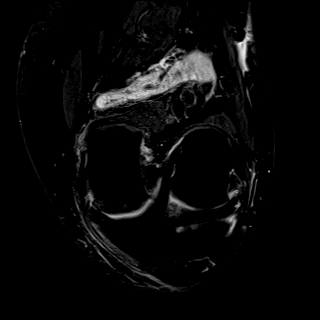
[im 16/16]
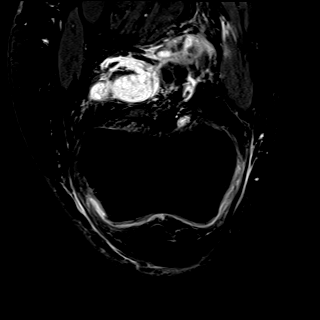

[40 of 40 positions shown; findings below may reference images not displayed]

FINDINGS: MENISCI

Medial meniscus: Large radial tear of the root of the posterior
horn.

Lateral meniscus:  Unremarkable

LIGAMENTS

Cruciates:  Unremarkable

Collaterals:  Mildly thickened proximal MCL, query sprain.

CARTILAGE

Patellofemoral:  Mild degenerative chondral thinning.

Medial: Prominent degenerative chondral thinning with faint
subcortical marrow edema.

Lateral: 0.6 cm partial-thickness chondral defect posteriorly along
the lateral tibial plateau on image [DATE]. Small focus of subcortical
marrow edema anteriorly along the lateral tibial plateau on image
[DATE] suggesting a non-fragmented osteochondral lesion.

Joint:  Moderate knee effusion.

Popliteal Fossa: Moderate to large ruptured Baker's cyst. Resulting
edema tracks along popliteal fascia planes.

Extensor Mechanism:  Unremarkable

Bones: No significant extra-articular osseous abnormalities
identified.

Other: No supplemental non-categorized findings.
IMPRESSION: 1. Large radial tear of the root of the posterior horn medial
meniscus.
2. Prominent degenerative chondral thinning in the medial
compartment with mild chondral thinning in the patellofemoral joint
and lateral compartment.
3. 0.6 cm partial-thickness chondral defect posteriorly along the
lateral tibial plateau.
4. Moderate knee effusion with moderate to large ruptured Baker's
cyst.
5. Mildly thickened proximal MCL, query sprain.

## 2021-07-24 ENCOUNTER — Other Ambulatory Visit: Payer: Self-pay

## 2021-07-24 ENCOUNTER — Ambulatory Visit
Admission: RE | Admit: 2021-07-24 | Discharge: 2021-07-24 | Disposition: A | Discharge status: Home / Self Care | Payer: Managed Care, Other (non HMO) | Source: Ambulatory Visit | Attending: Internal Medicine | Admitting: Internal Medicine

## 2021-07-24 DIAGNOSIS — Z1231 Encounter for screening mammogram for malignant neoplasm of breast: Secondary | ICD-10-CM | POA: Insufficient documentation

## 2021-09-19 ENCOUNTER — Encounter: Admission: RE | Payer: Self-pay | Source: Home / Self Care

## 2021-09-19 ENCOUNTER — Ambulatory Visit: Admission: RE | Admit: 2021-09-19 | Payer: Managed Care, Other (non HMO) | Source: Home / Self Care

## 2021-09-19 SURGERY — COLONOSCOPY WITH PROPOFOL
Anesthesia: General

## 2022-05-15 ENCOUNTER — Other Ambulatory Visit: Payer: Self-pay | Admitting: Internal Medicine

## 2022-05-15 DIAGNOSIS — Z1231 Encounter for screening mammogram for malignant neoplasm of breast: Secondary | ICD-10-CM

## 2023-02-10 ENCOUNTER — Ambulatory Visit
Admission: RE | Admit: 2023-02-10 | Discharge: 2023-02-10 | Disposition: A | Discharge status: Home / Self Care | Payer: Managed Care, Other (non HMO) | Source: Ambulatory Visit | Attending: Internal Medicine | Admitting: Internal Medicine

## 2023-02-10 DIAGNOSIS — Z1231 Encounter for screening mammogram for malignant neoplasm of breast: Secondary | ICD-10-CM | POA: Diagnosis not present

## 2024-01-19 ENCOUNTER — Other Ambulatory Visit: Payer: Self-pay | Admitting: Internal Medicine

## 2024-01-19 DIAGNOSIS — Z1231 Encounter for screening mammogram for malignant neoplasm of breast: Secondary | ICD-10-CM

## 2024-04-03 ENCOUNTER — Ambulatory Visit: Payer: Self-pay

## 2024-04-03 DIAGNOSIS — K640 First degree hemorrhoids: Secondary | ICD-10-CM | POA: Diagnosis not present

## 2024-04-03 DIAGNOSIS — K635 Polyp of colon: Secondary | ICD-10-CM | POA: Diagnosis not present

## 2024-04-03 DIAGNOSIS — Z1211 Encounter for screening for malignant neoplasm of colon: Secondary | ICD-10-CM | POA: Diagnosis present

## 2024-04-03 DIAGNOSIS — K5730 Diverticulosis of large intestine without perforation or abscess without bleeding: Secondary | ICD-10-CM | POA: Diagnosis not present

## 2024-06-29 ENCOUNTER — Ambulatory Visit
Admission: RE | Admit: 2024-06-29 | Discharge: 2024-06-29 | Disposition: A | Discharge status: Home / Self Care | Source: Ambulatory Visit | Attending: Internal Medicine | Admitting: Internal Medicine

## 2024-06-29 DIAGNOSIS — Z1231 Encounter for screening mammogram for malignant neoplasm of breast: Secondary | ICD-10-CM | POA: Insufficient documentation
